# Patient Record
Sex: Female | Born: 1955 | Race: White | Hispanic: No | Marital: Married | Smoking: Never smoker
Health system: Southern US, Community
[De-identification: ages and names within clinical notes are randomized; demographics above are authoritative.]

## PROBLEM LIST (undated history)

## (undated) DIAGNOSIS — R112 Nausea with vomiting, unspecified: Secondary | ICD-10-CM

## (undated) DIAGNOSIS — E039 Hypothyroidism, unspecified: Secondary | ICD-10-CM

## (undated) DIAGNOSIS — M1990 Unspecified osteoarthritis, unspecified site: Secondary | ICD-10-CM

## (undated) DIAGNOSIS — Z9889 Other specified postprocedural states: Secondary | ICD-10-CM

## (undated) DIAGNOSIS — Z85828 Personal history of other malignant neoplasm of skin: Secondary | ICD-10-CM

## (undated) DIAGNOSIS — I10 Essential (primary) hypertension: Secondary | ICD-10-CM

## (undated) DIAGNOSIS — C801 Malignant (primary) neoplasm, unspecified: Secondary | ICD-10-CM

## (undated) DIAGNOSIS — R51 Headache: Secondary | ICD-10-CM

## (undated) DIAGNOSIS — R739 Hyperglycemia, unspecified: Secondary | ICD-10-CM

## (undated) DIAGNOSIS — M75100 Unspecified rotator cuff tear or rupture of unspecified shoulder, not specified as traumatic: Secondary | ICD-10-CM

## (undated) DIAGNOSIS — I8390 Asymptomatic varicose veins of unspecified lower extremity: Secondary | ICD-10-CM

## (undated) DIAGNOSIS — K224 Dyskinesia of esophagus: Secondary | ICD-10-CM

## (undated) HISTORY — PX: REDUCTION MAMMAPLASTY: SUR839

## (undated) HISTORY — DX: Asymptomatic varicose veins of unspecified lower extremity: I83.90

## (undated) HISTORY — PX: BREAST BIOPSY: SHX20

---
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
## 1992-12-29 HISTORY — PX: THYROIDECTOMY, PARTIAL: SHX18

## 1994-12-29 HISTORY — PX: KNEE ARTHROTOMY: SUR107

## 1995-12-30 HISTORY — PX: BUNIONECTOMY: SHX129

## 2000-06-01 ENCOUNTER — Encounter: Payer: Self-pay | Admitting: Obstetrics and Gynecology

## 2000-06-01 ENCOUNTER — Encounter: Admission: RE | Admit: 2000-06-01 | Discharge: 2000-06-01 | Payer: Self-pay | Admitting: Obstetrics and Gynecology

## 2000-07-28 ENCOUNTER — Other Ambulatory Visit: Admission: RE | Admit: 2000-07-28 | Discharge: 2000-07-28 | Payer: Self-pay | Admitting: Obstetrics and Gynecology

## 2001-06-09 ENCOUNTER — Encounter: Payer: Self-pay | Admitting: Obstetrics and Gynecology

## 2001-06-09 ENCOUNTER — Encounter: Admission: RE | Admit: 2001-06-09 | Discharge: 2001-06-09 | Payer: Self-pay | Admitting: Obstetrics and Gynecology

## 2001-06-10 ENCOUNTER — Encounter: Payer: Self-pay | Admitting: Obstetrics and Gynecology

## 2001-06-10 ENCOUNTER — Encounter: Admission: RE | Admit: 2001-06-10 | Discharge: 2001-06-10 | Payer: Self-pay | Admitting: Obstetrics and Gynecology

## 2002-07-05 ENCOUNTER — Encounter: Admission: RE | Admit: 2002-07-05 | Discharge: 2002-07-05 | Payer: Self-pay | Admitting: Obstetrics and Gynecology

## 2002-07-05 ENCOUNTER — Encounter: Payer: Self-pay | Admitting: Obstetrics and Gynecology

## 2002-12-29 HISTORY — PX: ABDOMINAL HYSTERECTOMY: SHX81

## 2002-12-29 HISTORY — PX: CHOLECYSTECTOMY: SHX55

## 2003-08-14 ENCOUNTER — Encounter: Admission: RE | Admit: 2003-08-14 | Discharge: 2003-08-14 | Payer: Self-pay | Admitting: Obstetrics and Gynecology

## 2003-08-14 ENCOUNTER — Encounter: Payer: Self-pay | Admitting: Obstetrics and Gynecology

## 2004-08-14 ENCOUNTER — Encounter: Admission: RE | Admit: 2004-08-14 | Discharge: 2004-08-14 | Payer: Self-pay | Admitting: Internal Medicine

## 2005-02-11 ENCOUNTER — Inpatient Hospital Stay (HOSPITAL_COMMUNITY): Admission: RE | Admit: 2005-02-11 | Discharge: 2005-02-13 | Payer: Self-pay | Admitting: Obstetrics and Gynecology

## 2005-02-11 ENCOUNTER — Encounter (INDEPENDENT_AMBULATORY_CARE_PROVIDER_SITE_OTHER): Payer: Self-pay | Admitting: *Deleted

## 2005-11-05 ENCOUNTER — Encounter: Admission: RE | Admit: 2005-11-05 | Discharge: 2005-11-05 | Payer: Self-pay | Admitting: Obstetrics and Gynecology

## 2006-12-08 ENCOUNTER — Encounter: Admission: RE | Admit: 2006-12-08 | Discharge: 2006-12-08 | Payer: Self-pay | Admitting: Obstetrics and Gynecology

## 2007-12-10 ENCOUNTER — Encounter: Admission: RE | Admit: 2007-12-10 | Discharge: 2007-12-10 | Payer: Self-pay | Admitting: Obstetrics and Gynecology

## 2008-03-15 ENCOUNTER — Ambulatory Visit (HOSPITAL_COMMUNITY): Admission: RE | Admit: 2008-03-15 | Discharge: 2008-03-15 | Payer: Self-pay | Admitting: Obstetrics and Gynecology

## 2008-12-12 ENCOUNTER — Encounter: Admission: RE | Admit: 2008-12-12 | Discharge: 2008-12-12 | Payer: Self-pay | Admitting: Obstetrics and Gynecology

## 2008-12-15 ENCOUNTER — Encounter: Admission: RE | Admit: 2008-12-15 | Discharge: 2008-12-15 | Payer: Self-pay | Admitting: Obstetrics and Gynecology

## 2009-01-03 ENCOUNTER — Ambulatory Visit (HOSPITAL_COMMUNITY): Admission: RE | Admit: 2009-01-03 | Discharge: 2009-01-03 | Payer: Self-pay | Admitting: Obstetrics and Gynecology

## 2010-01-15 ENCOUNTER — Encounter: Admission: RE | Admit: 2010-01-15 | Discharge: 2010-01-15 | Payer: Self-pay | Admitting: Obstetrics and Gynecology

## 2011-01-16 ENCOUNTER — Encounter
Admission: RE | Admit: 2011-01-16 | Discharge: 2011-01-16 | Payer: Self-pay | Source: Home / Self Care | Attending: Obstetrics and Gynecology | Admitting: Obstetrics and Gynecology

## 2011-05-16 NOTE — Op Note (Signed)
NAMEMARYBEL, Ruth Long                ACCOUNT NO.:  [id number]   MEDICAL RECORD NO.:  [id number]          PATIENT TYPE:  INP   LOCATION:  X003                         FACILITY:  [facility]   PHYSICIAN:  Malachi Pro. Ambrose Mantle, M.D. DATE OF BIRTH:  March 17, 1956   DATE OF PROCEDURE:  02/11/2005  DATE OF DISCHARGE:                                 OPERATIVE REPORT   PREOPERATIVE DIAGNOSES:  1.  Dyspareunia.  2.  Fibroid.  3.  Right ovarian cyst with a septation on ultrasound.   POSTOPERATIVE DIAGNOSES:  1.  Dyspareunia.  2.  Fibroid.  3.  Right ovarian cyst with a septation on ultrasound.  4.  Hematuria of undetermined origin.   OPERATIONS:  1.  Abdominal hysterectomy.  2.  Bilateral salpingo-oophorectomy.  3.  Cystoscopy.   SURGEONS:  Dr. Ambrose Mantle performed the TAH and BSO with Dr. Jackelyn Knife as  assistant.  Dr. Jackelyn Knife performed the cystoscopy.   ANESTHESIA:  General.   DESCRIPTION OF PROCEDURE:  The patient was brought to the operating room and  given a general anesthesia and intubated.  She was placed on frog leg  position.  Because of her allergy to iodine, we elected not to use Betadine  and used a non-iodine prep even though it is not certain that she is  sensitive to Betadine.  The vulva, vagina, perineum and urethra were  prepped.  The bladder was cauterized with a Foley and hooked to straight  drain.  Exam revealed the uterus to be upper limit of normal size.  The left  adnexa was clear.  The right adnexa contained a 4 cm cyst on the ovary.  The  patient was placed supine.  The abdomen was draped as a sterile field and a  transverse incision was made about two fingerbreadths above her C-section  incision and carried in layers through the skin, subcutaneous tissue and  fascia.  The fascia was then separated from the rectus muscle superiorly and  inferiorly.  The rectus muscle was split in the midline.  The peritoneum was  opened vertically.  Exploration of the upper abdomen revealed  the liver to  be smooth.  Both kidneys felt normal.  No upper abdominal abnormalities were  noted.  Pelvic washings were obtained and preserved.  Exploration of the  pelvis revealed the uterus to have a 3 cm fibroid on the posterior fundus.  Both tubes showed evidence of previous tubal cauterization.  The left ovary  was normal.  The right ovary was about a 4 cm structure that had no  excrescences and appeared to be a clear walled cyst.  Both cul-de-sacs were  free of disease.  Packs and retractors were used for exposure.  The uterus  was elevated into the operative field and both round ligaments were divided  with the Bovie and a bladder flap was developed.  The infundibular pelvic  ligaments were isolated and cut between clamps and doubly suture ligated.  Uterine vessels were skeletonized, clamped, cut and suture ligated.  Parametrial and paracervical tissues were clamped, cut and suture ligated.  The uterosacral ligaments were clamped, cut,  suture ligated and held.  The  the vaginal angles were entered and the uterus was removed by transecting  the upper vagina.  I had sent the right tube and ovary for frozen section.  Dr. Quentin Ore reported that it was a benign serous cyst.  The central portion  of the vagina was closed with interrupted figure-of-eight sutures of 0  Vicryl.  About this time, the anesthetist reported that there was a little  bit of hematuria in the tubing, so I inflated the bladder with several  hundred cubic centimeters of methylene blue stain fluid.  There was no  leakage.  I carefully palpated all sutures to make sure there was no  evidence that any of them were close to the bladder.  I then drained the  bladder and had the nurse anesthetist inject indigo carmine intravenously.  A diligent search was made for hemostasis and I waited 15 minutes after the  indigo carmine was injected to try to be certain that the kidneys had  processed the indigo carmine.  There was no  leakage seen.  Hemostasis was  complete.  Packs and retractors were removed.  The abdominal wall was closed  with a running suture of 0 Vicryl on the peritoneum, interrupted 0 Vicryl on  the rectus muscle, two running sutures of 0 Vicryl on the fascia, a running  3-0 Vicryl on the subcutaneous tissue and staples on the skin.  After the  incision was cleaned and dressed, the patient was placed in frog leg  position and Dr. Jackelyn Knife used a cystoscope to enter the bladder and saw  both ureteral orifices.  We could easily see the jets of urine coming out of  both ureteral orifices.  So the etiology of the hematuria remains uncertain.  It must have came from either pulling on the catheter against the bladder  neck or possibly putting some pressure on the Foley bulb as I was pushing  the bladder inferiorly.  At any rate, there was no evidence of ureteral or  bladder injury and the patient is returning to recovery in satisfactory  condition.      TFH/MEDQ  D:  02/11/2005  T:  02/11/2005  Job:  [id number]

## 2011-05-16 NOTE — Op Note (Signed)
NAMEWYNONA, Ruth Long                ACCOUNT NO.:  [id number]   MEDICAL RECORD NO.:  [id number]          PATIENT TYPE:  INP   LOCATION:  0440                         FACILITY:  [facility]   PHYSICIAN:  Zenaida Niece, M.D.DATE OF BIRTH:  05/15/1956   DATE OF PROCEDURE:  02/13/2005  DATE OF DISCHARGE:                                 OPERATIVE REPORT   PREOPERATIVE DIAGNOSIS:  Intraoperative hematuria.   POSTOPERATIVE DIAGNOSIS:  Intraoperative hematuria.   PROCEDURE:  Cystoscopy.   PROCEDURE IN DETAIL:  Dr. Ambrose Mantle has previously dictated the operative note  for his hysterectomy. During the hysterectomy, it was noted that there were  some hematuria. Once Dr. Ambrose Mantle was done with the hysterectomy portion and  the skin was closed, the patient was placed in the frog-leg position, and  her Foley catheter was removed. Her previous prep appeared to be adequate. A  70-degree cystoscope was inserted through the urethra into the bladder. The  patient had previously been given indigo carmine IV, and we waited 15  minutes to make sure there was no intra-abdominal spill from the ureters.  The bladder was filled via the cystoscope, and the bladder was inspected and  found to be normal without sutures in the bladder. Both ureteral orifices  were easily identified, and strong jets of blue urine were seen to come from  each ureteral orifice confirming ureteral patency. No explanation for the  hematuria was identified. The cystoscope was removed, and a Foley catheter  was placed. The patient was then awakened and taken to the recovery in  stable condition.      TDM/MEDQ  D:  02/13/2005  T:  02/13/2005  Job:  [id number]

## 2011-05-16 NOTE — H&P (Signed)
NAMESHARNISE, BLOUGH                ACCOUNT NO.:  [id number]   MEDICAL RECORD NO.:  [id number]          PATIENT TYPE:  INP   LOCATION:  NA                           FACILITY:  [facility]   PHYSICIAN:  Malachi Pro. Ambrose Mantle, M.D. DATE OF BIRTH:  24-Sep-1956   DATE OF ADMISSION:  02/11/2005  DATE OF DISCHARGE:                                HISTORY & PHYSICAL   PRESENT ILLNESS:  This is a 55 year old white female, para 1-0-0-1, who is  admitted to the hospital for abdominal hysterectomy, bilateral salpingo-  oophorectomy, because of pelvic pain, dyspareunia, and an adnexal cyst on  the right.  This patient's last menstrual period was in 2000.  Recently, she  began having lower abdominal pain, and underwent an ultrasound that showed a  fibroid and an adnexal cyst on the right that had a septation.  There was no  free fluid present.  The patient states that intercourse is out of the  question.  She has significant pain when the penis hits something inside.  She is admitted now for abdominal hysterectomy and bilateral salpingo-  oophorectomy.   PAST MEDICAL HISTORY:   ALLERGIES:  1.  Allergy to PENICILLIN.  2.  She states she is allergic to IODINE.   MEDICATIONS:  1.  Diuretic.  2.  Synthroid.   OPERATIONS:  1.  Tonsillectomy.  2.  Cholecystectomy.  3.  Cesarean section.  4.  Tubal ligation.  5.  Breast reduction.  6.  Bunions on her feet.  7.  Knee surgery.  8.  Appendectomy.  9.  Thyroidectomy.   ILLNESSES:  She did have a thyroid abnormality.  She has no known heart  problems.   SOCIAL HISTORY:  Does not drink or smoke.   REVIEW OF SYSTEMS:  Negative, except for some headaches, and as in the  present illness.   FAMILY HISTORY:  Mother died at 75 of cancer.  Father died at 34 of cancer.  No sisters.  One brother with heart disease and diabetes.   PHYSICAL EXAMINATION:  GENERAL:  Well-developed, attractive white female, in  no distress.  VITAL SIGNS:  Weight 141 pounds.  Blood  pressure 130/100, pulse 80.  HEENT:  No cranial abnormalities.  Extraocular movements are intact.  Nose  and pharynx clear.  NECK:  Supple without thyromegaly.  HEART:  Normal size and sounds.  No murmurs.  LUNGS:  Clear to auscultation.  BREASTS:  Soft without masses.  Reduction mammoplasty scar is present.  ABDOMEN:  Soft and nontender.  No masses are palpable.  Liver, spleen, and  kidneys are not felt.  PELVIC:  Vulva and vagina are clean.  BUS negative.  Cervix is clean.  Pap  smear on August 14, 2004 was negative for intraepithelial lesion and  malignancy.  The uterus is anterior, hard to feel.  By ultrasound, it showed  a fibroid, 3.3 x 2.9 cm.  There is a palpable right adnexal mass, 4 x 4 cm,  that by ultrasound showed a cyst with a thin septation.  RECTAL:  Negative.   LABORATORY DATA:  CA125 was 13.8.  ADMITTING IMPRESSION:  1.  Pelvic pain.  2.  Dyspareunia.  3.  Right adnexal mass.  4.  Leiomyomata.   PLAN:  The patient is admitted for abdominal hysterectomy, bilateral  salpingo-oophorectomy.  She has been informed that the surgery will not  necessarily eliminate her pain, but will give a diagnosis.  The best way to  approach her surgery, whether a transverse or midline incision, is not  clear.  My thought is that the likelihood of malignancy is quite low, and  quite likely I will make a transverse incision.  She has been informed of  the risks of surgery including, but not limited to, heart attack, stroke,  pulmonary embolus, wound disruption, hemorrhage with need for reoperation  and/or transfusion, fistula formation, nerve injury, and intestinal  obstruction.  She also has been informed that surgery may have an  unpredictable impact on her sex drive.  She understands and agrees to  proceed.      TFH/MEDQ  D:  02/10/2005  T:  02/10/2005  Job:  [id number]

## 2011-05-16 NOTE — Discharge Summary (Signed)
[name], SPAID                ACCOUNT NO.:  [id number]   MEDICAL RECORD NO.:  [id number]          PATIENT TYPE:  INP   LOCATION:  0440                         [facility]ACILITY:  [facility]   PHYSICIAN:  Malachi Pro. Ambrose Mantle, M.D. DATE OF BIRTH:  07-Sep-1956   DATE OF ADMISSION:  02/11/2005  DATE OF DISCHARGE:  02/13/2005                                 DISCHARGE SUMMARY   HISTORY OF PRESENT ILLNESS:  This is a 55 year old white female admitted  because of right pelvic mass, fibroid, and dyspareunia.   HOSPITAL COURSE:  The patient underwent abdominal hysterectomy, bilateral  salpingo-oophorectomy, and cystoscopy.  Dr. Ambrose Mantle did the abdominal  hysterectomy and BSO and the patient was noted to have a slight amount of  hematuria so Dr. Jackelyn Knife did a cystoscopy to rule out any intravesical  lesions.  Postoperatively, the patient did well.  She voided well without  difficulty, passed flatus, tolerated a liquid diet, and on the second  postoperative day, her incision seemed to be healing well without difficulty  and she was discharged.   LABORATORY DATA:  Initial hemoglobin 14.1, hematocrit 40.9, white count  8100, platelet count 294,000, normal differential.  Comprehensive metabolic  profile was normal.  Urinalysis was negative.  Chest x-ray showed no active  disease.  EKG showed borderline EKG with incomplete right bundle branch  block.  Pathology report showed right ovary and fallopian tube with serocyst  adenofibroma 5 cm, benign right fallopian tube.  Uterine cervix, left ovary,  and fallopian tube:  Cervix with chronic cervicitis and squamous metaplasia.  No intra-epithelial lesion.  Atrophic endometrium.  No hyperplasia or  malignancy.  Myometrium with adenomyosis and multiple leiomyomas.  Left  ovary with serocyst adenofibroma 1 cm, benign left fallopian tube.   FINAL DIAGNOSES:  1.  Dyspareunia.  2.  Leiomyomata uteri.  3.  Adenomyosis.  4.  Bilateral cyst adenofibromas of the  ovaries.   FINAL CONDITION:  Improved.   INSTRUCTIONS:  1.  Regular diet.  2.  No vaginal entrance.  3.  No heavy lifting or strenuous activity.  4.  Call with any temperature elevation greater than 100.4 degrees.  5.  Call with any problems.   OPERATION:  1.  Abdominal hysterectomy.  2.  Bilateral salpingo-oophorectomy.  3.  Cystoscopy.   MEDICATIONS:  1.  Mepergan Fortis 24 tablets 1 every 4-6 hours as needed for pain.  2.  If the patient's nausea is not relieved by the Phenergan, discontinue      the Phenergan and use Zofran 4 mg by mouth every 4-6 hours as needed.   FOLLOW UP:  The patient is to return in 4 days to have her staples removed.      TFH/MEDQ  D:  02/13/2005  T:  02/13/2005  Job:  [id number]

## 2011-12-19 ENCOUNTER — Other Ambulatory Visit: Payer: Self-pay | Admitting: Obstetrics and Gynecology

## 2011-12-19 DIAGNOSIS — Z1231 Encounter for screening mammogram for malignant neoplasm of breast: Secondary | ICD-10-CM

## 2012-01-19 ENCOUNTER — Ambulatory Visit: Payer: Self-pay

## 2012-01-20 ENCOUNTER — Ambulatory Visit
Admission: RE | Admit: 2012-01-20 | Discharge: 2012-01-20 | Disposition: A | Discharge status: Home / Self Care | Payer: BC Managed Care – PPO | Source: Ambulatory Visit | Attending: Obstetrics and Gynecology | Admitting: Obstetrics and Gynecology

## 2012-01-20 DIAGNOSIS — Z1231 Encounter for screening mammogram for malignant neoplasm of breast: Secondary | ICD-10-CM

## 2012-01-23 ENCOUNTER — Other Ambulatory Visit: Payer: Self-pay | Admitting: Obstetrics and Gynecology

## 2012-01-23 DIAGNOSIS — R928 Other abnormal and inconclusive findings on diagnostic imaging of breast: Secondary | ICD-10-CM

## 2012-01-29 ENCOUNTER — Ambulatory Visit
Admission: RE | Admit: 2012-01-29 | Discharge: 2012-01-29 | Disposition: A | Discharge status: Home / Self Care | Payer: BC Managed Care – PPO | Source: Ambulatory Visit | Attending: Obstetrics and Gynecology | Admitting: Obstetrics and Gynecology

## 2012-01-29 DIAGNOSIS — R928 Other abnormal and inconclusive findings on diagnostic imaging of breast: Secondary | ICD-10-CM

## 2013-01-04 ENCOUNTER — Other Ambulatory Visit: Payer: Self-pay | Admitting: Obstetrics and Gynecology

## 2013-01-04 DIAGNOSIS — Z9889 Other specified postprocedural states: Secondary | ICD-10-CM

## 2013-01-04 DIAGNOSIS — Z1231 Encounter for screening mammogram for malignant neoplasm of breast: Secondary | ICD-10-CM

## 2013-01-31 ENCOUNTER — Ambulatory Visit
Admission: RE | Admit: 2013-01-31 | Discharge: 2013-01-31 | Disposition: A | Discharge status: Home / Self Care | Payer: BC Managed Care – PPO | Source: Ambulatory Visit | Attending: Obstetrics and Gynecology | Admitting: Obstetrics and Gynecology

## 2013-01-31 DIAGNOSIS — Z1231 Encounter for screening mammogram for malignant neoplasm of breast: Secondary | ICD-10-CM

## 2013-01-31 DIAGNOSIS — Z9889 Other specified postprocedural states: Secondary | ICD-10-CM

## 2014-01-23 ENCOUNTER — Other Ambulatory Visit: Payer: Self-pay

## 2014-01-23 DIAGNOSIS — Z1231 Encounter for screening mammogram for malignant neoplasm of breast: Secondary | ICD-10-CM

## 2014-01-23 DIAGNOSIS — Z9889 Other specified postprocedural states: Secondary | ICD-10-CM

## 2014-02-07 ENCOUNTER — Ambulatory Visit
Admission: RE | Admit: 2014-02-07 | Discharge: 2014-02-07 | Disposition: A | Discharge status: Home / Self Care | Payer: BC Managed Care – PPO | Source: Ambulatory Visit

## 2014-02-07 DIAGNOSIS — Z1231 Encounter for screening mammogram for malignant neoplasm of breast: Secondary | ICD-10-CM

## 2014-02-07 DIAGNOSIS — Z9889 Other specified postprocedural states: Secondary | ICD-10-CM

## 2014-05-31 ENCOUNTER — Other Ambulatory Visit: Payer: Self-pay | Admitting: *Deleted

## 2014-05-31 DIAGNOSIS — I83893 Varicose veins of bilateral lower extremities with other complications: Secondary | ICD-10-CM

## 2014-07-03 ENCOUNTER — Other Ambulatory Visit: Payer: Self-pay | Admitting: Orthopedic Surgery

## 2014-07-03 NOTE — H&P (Signed)
Ruth Long is an 58 y.o. female.   Chief Complaint: R shoulder pain HPI: The patient is a 58 year old female. They are right handed and present today reporting pain at the right shoulder that began 2 year(s) ago. The patient reports that the shoulder symptoms began following a specific injury. The injury resulted from [facility] a pop in shoulder while mowing the lawn that occurred at home 2 years. The patient reports symptoms which include shoulder pain, catching, decreased range of motion and night pain. There is no radiation of symptoms. The patient reports that these symptoms are present frequently (when she moves it). The patient describes these symptoms as moderate in severity (Severe when she lifts it) and worsening. Symptoms are exacerbated by motion at the shoulder, elevation of the shoulder and lifting. Associated symptoms include numbness in the arm (tingling). The patient was previously evaluated by a primary physician (Dr. Virl Cagey in [place]). Note for "Shoulder Complaints": Has pain medications that help some, but wants to stay off of those  No past medical history on file.  No past surgical history on file.  No family history on file. Social History:  has no tobacco, alcohol, and drug history on file.  Allergies: Allergies not on file   (Not in a hospital admission)  No results found for this or any previous visit (from the past 48 hour(s)). No results found.  Review of Systems  Constitutional: Negative.   HENT: Negative.   Eyes: Negative.   Respiratory: Negative.   Cardiovascular: Negative.   Gastrointestinal: Negative.   Genitourinary: Negative.   Musculoskeletal: Positive for joint pain.  Skin: Negative.   Neurological: Negative.   Psychiatric/Behavioral: Negative.     There were no vitals taken for this visit. Physical Exam  Constitutional: She is oriented to person, place, and time. She appears well-developed and well-nourished.  HENT:  Head: Normocephalic  and atraumatic.  Eyes: Conjunctivae and EOM are normal. Pupils are equal, round, and reactive to light.  Neck: Normal range of motion. Neck supple.  Cardiovascular: Normal rate and regular rhythm.   Respiratory: Effort normal and breath sounds normal.  GI: Soft. Bowel sounds are normal.  Musculoskeletal:  On exam positive impingement sign. Decreased internal rotation in abduction, weakness in external rotation. Tender in the anterior subacromial region. Negative crossover. Neurovascularly intact.    Inspection of the cervical spine reveals a normal lordosis without evidence of paraspinous spasms or soft tissue swelling. Nontender to palpation. Full flexion, full extension, full left and right lateral rotation. Extension combined with lateral flexion does not reproduce pain. Negative Tinel's at median and ulnar nerves at the elbow. Negative carpal compression test at the wrists. Motor of the upper extremities is 5/5 including biceps, triceps, brachioradialis,wrist flexion, wrist extension, finger flexion, finger extension. Reflexes are normoreflexic. Sensory exam is intact to light touch. There is no Hoffmann sign. Nontender over the thoracic spine.  Negative lift off. Negative Speeds test.  Neurological: She is alert and oriented to person, place, and time. She has normal reflexes.  Skin: Skin is warm and dry.  Psychiatric: She has a normal mood and affect.    Three view radiographs of the shoulder demonstrate type II acromion. Minimal AC arthrosis.  MRI arthrogram with full thickness retracted RCT  Assessment/Plan R shoulder RCT  Pt with pain for 2+ years R shoulder refractory to conservative tx with RCT on MR arthrogram. Dr. Tonita Cong discussed options with the patient, given duration of time, failed conservative tx and retracted tear, recommend  proceeding with R shoulder mini-open RCR/SAD. We discussed surgery itself as well as risks, complications and alternatives including but not  limited to DVT, PE, infx, bleeding, failure of procedure, need for secondary procedure, anesthesia risk, even death. Discussed post-op protocols, sling, PT. She desires to proceed. Follow up 10-14 days post-op for suture removal.  Plan R shoulder mini-open SAD/RCR  BISSELL, JACLYN M. PA-C for Dr. Tonita Cong 07/03/2014, 8:15 PM

## 2014-07-06 ENCOUNTER — Encounter (HOSPITAL_COMMUNITY): Payer: Self-pay | Admitting: Pharmacy Technician

## 2014-07-10 ENCOUNTER — Other Ambulatory Visit (HOSPITAL_COMMUNITY): Payer: Self-pay | Admitting: *Deleted

## 2014-07-10 NOTE — Patient Instructions (Addendum)
Ruth Long  07/10/2014                           YOUR PROCEDURE IS SCHEDULED ON: 07/13/14 at 7:30 am               [place]  SIGNS TO SHORT STAY CENTER                 ARRIVE AT SHORT STAY AT:  5:30 AM               CALL THIS NUMBER IF ANY PROBLEMS THE DAY OF SURGERY :               [phone]                                REMEMBER:   Do not eat food or drink liquids AFTER MIDNIGHT                  Take these medicines the morning of surgery with               A SIPS OF WATER :   PRILOSEC / FENFIBRATE      Do not wear jewelry, make-up   Do not wear lotions, powders, or perfumes.   Do not shave legs or underarms 12 hrs. before surgery (men may shave face)  Do not bring valuables to the hospital.  Contacts, dentures or bridgework may not be worn into surgery.  Leave suitcase in the car. After surgery it may be brought to your room.  For patients admitted to the hospital more than one night, checkout time is            11:00 AM                                                       The day of discharge.   Patients discharged the day of surgery will not be allowed to drive home.            If going home same day of surgery, must have someone stay with you              FIRST 24 hrs at home and arrange for some one to drive you              home from hospital.   ________________________________________________________________________                                                                        Early  Before surgery, you can play an important role.  Because skin is not sterile, your skin needs to be as free of germs as possible.  You can reduce the number of  germs on your skin by washing with CHG (chlorahexidine gluconate) soap before surgery.  CHG is an antiseptic cleaner which kills germs and bonds with the skin to continue killing germs even after  washing. Please DO NOT use if you have an allergy to CHG or antibacterial soaps.  If your skin becomes reddened/irritated stop using the CHG and inform your nurse when you arrive at Short Stay. Do not shave (including legs and underarms) for at least 48 hours prior to the first CHG shower.  You may shave your face. Please follow these instructions carefully:   1.  Shower with CHG Soap the night before surgery and the  morning of Surgery.   2.  If you choose to wash your hair, wash your hair first as usual with your  normal  Shampoo.   3.  After you shampoo, rinse your hair and body thoroughly to remove the  shampoo.                                         4.  Use CHG as you would any other liquid soap.  You can apply chg directly  to the skin and wash . Gently wash with scrungie or clean wascloth    5.  Apply the CHG Soap to your body ONLY FROM THE NECK DOWN.   Do not use on open                           Wound or open sores. Avoid contact with eyes, ears mouth and genitals (private parts).                        Genitals (private parts) with your normal soap.              6.  Wash thoroughly, paying special attention to the area where your surgery  will be performed.   7.  Thoroughly rinse your body with warm water from the neck down.   8.  DO NOT shower/wash with your normal soap after using and rinsing off  the CHG Soap .                9.  Pat yourself dry with a clean towel.             10.  Wear clean pajamas.             11.  Place clean sheets on your bed the night of your first shower and do not  sleep with pets.  Day of Surgery : Do not apply any lotions/deodorants the morning of surgery.  Please wear clean clothes to the hospital/surgery center.  FAILURE TO FOLLOW THESE INSTRUCTIONS MAY RESULT IN THE CANCELLATION OF YOUR SURGERY    PATIENT  SIGNATURE_________________________________  ______________________________________________________________________     Ruth Long  An incentive spirometer is a tool that can help keep your lungs clear and active. This tool measures how well you are filling your lungs with each breath. Taking long deep breaths may help reverse or decrease the chance of developing breathing (pulmonary) problems (especially infection) following:  A long period of time when you are unable to move or be active. BEFORE THE PROCEDURE   If the spirometer includes an indicator to show your best effort, your nurse or respiratory therapist will set it  to a desired goal.  If possible, sit up straight or lean slightly forward. Try not to slouch.  Hold the incentive spirometer in an upright position. INSTRUCTIONS FOR USE  1. Sit on the edge of your bed if possible, or sit up as far as you can in bed or on a chair. 2. Hold the incentive spirometer in an upright position. 3. Breathe out normally. 4. Place the mouthpiece in your mouth and seal your lips tightly around it. 5. Breathe in slowly and as deeply as possible, raising the piston or the ball toward the top of the column. 6. Hold your breath for 3-5 seconds or for as long as possible. Allow the piston or ball to fall to the bottom of the column. 7. Remove the mouthpiece from your mouth and breathe out normally. 8. Rest for a few seconds and repeat Steps 1 through 7 at least 10 times every 1-2 hours when you are awake. Take your time and take a few normal breaths between deep breaths. 9. The spirometer may include an indicator to show your best effort. Use the indicator as a goal to work toward during each repetition. 10. After each set of 10 deep breaths, practice coughing to be sure your lungs are clear. If you have an incision (the cut made at the time of surgery), support your incision when coughing by placing a pillow or rolled up towels firmly  against it. Once you are able to get out of bed, walk around indoors and cough well. You may stop using the incentive spirometer when instructed by your caregiver.  RISKS AND COMPLICATIONS  Take your time so you do not get dizzy or light-headed.  If you are in pain, you may need to take or ask for pain medication before doing incentive spirometry. It is harder to take a deep breath if you are having pain. AFTER USE  Rest and breathe slowly and easily.  It can be helpful to keep track of a log of your progress. Your caregiver can provide you with a simple table to help with this. If you are using the spirometer at home, follow these instructions: [place] IF:   You are having difficultly using the spirometer.  You have trouble using the spirometer as often as instructed.  Your pain medication is not giving enough relief while using the spirometer.  You develop fever of 100.5 F (38.1 C) or higher. SEEK IMMEDIATE MEDICAL CARE IF:   You cough up bloody sputum that had not been present before.  You develop fever of 102 F (38.9 C) or greater.  You develop worsening pain at or near the incision site. MAKE SURE YOU:   Understand these instructions.  Will watch your condition.  Will get help right away if you are not doing well or get worse. Document Released: 04/27/2007 Document Revised: 03/08/2012 Document Reviewed: 06/28/2007 [facility] Patient Information 2014 [place], [place].   ________________________________________________________________________

## 2014-07-11 ENCOUNTER — Encounter (HOSPITAL_COMMUNITY): Payer: Self-pay

## 2014-07-11 ENCOUNTER — Encounter (HOSPITAL_COMMUNITY)
Admission: RE | Admit: 2014-07-11 | Discharge: 2014-07-11 | Disposition: A | Discharge status: Home / Self Care | Payer: BC Managed Care – PPO | Source: Ambulatory Visit | Attending: Specialist | Admitting: Specialist

## 2014-07-11 ENCOUNTER — Other Ambulatory Visit: Payer: Self-pay

## 2014-07-11 ENCOUNTER — Ambulatory Visit (HOSPITAL_COMMUNITY)
Admission: RE | Admit: 2014-07-11 | Discharge: 2014-07-11 | Disposition: A | Discharge status: Home / Self Care | Payer: BC Managed Care – PPO | Source: Ambulatory Visit | Attending: Anesthesiology | Admitting: Anesthesiology

## 2014-07-11 DIAGNOSIS — I10 Essential (primary) hypertension: Secondary | ICD-10-CM | POA: Insufficient documentation

## 2014-07-11 DIAGNOSIS — Z01812 Encounter for preprocedural laboratory examination: Secondary | ICD-10-CM | POA: Insufficient documentation

## 2014-07-11 DIAGNOSIS — Z01810 Encounter for preprocedural cardiovascular examination: Secondary | ICD-10-CM | POA: Insufficient documentation

## 2014-07-11 DIAGNOSIS — Z9089 Acquired absence of other organs: Secondary | ICD-10-CM | POA: Insufficient documentation

## 2014-07-11 DIAGNOSIS — Z01818 Encounter for other preprocedural examination: Secondary | ICD-10-CM | POA: Insufficient documentation

## 2014-07-11 HISTORY — DX: Essential (primary) hypertension: I10

## 2014-07-11 HISTORY — DX: Unspecified rotator cuff tear or rupture of unspecified shoulder, not specified as traumatic: M75.100

## 2014-07-11 HISTORY — DX: Other specified postprocedural states: Z98.890

## 2014-07-11 HISTORY — DX: Headache: R51

## 2014-07-11 HISTORY — DX: Dyskinesia of esophagus: K22.4

## 2014-07-11 HISTORY — DX: Nausea with vomiting, unspecified: R11.2

## 2014-07-11 HISTORY — DX: Personal history of other malignant neoplasm of skin: Z85.828

## 2014-07-11 HISTORY — DX: Hyperglycemia, unspecified: R73.9

## 2014-07-11 HISTORY — DX: Malignant (primary) neoplasm, unspecified: C80.1

## 2014-07-11 LAB — CBC
HCT: 38.6 % (ref 36.0–46.0)
Hemoglobin: 12.6 g/dL (ref 12.0–15.0)
MCH: 27.5 pg (ref 26.0–34.0)
MCHC: 32.6 g/dL (ref 30.0–36.0)
MCV: 84.3 fL (ref 78.0–100.0)
PLATELETS: 302 10*3/uL (ref 150–400)
RBC: 4.58 MIL/uL (ref 3.87–5.11)
RDW: 13.5 % (ref 11.5–15.5)
WBC: 6 10*3/uL (ref 4.0–10.5)

## 2014-07-11 LAB — BASIC METABOLIC PANEL
Anion gap: 14 (ref 5–15)
BUN: 19 mg/dL (ref 6–23)
CO2: 30 mEq/L (ref 19–32)
Calcium: 10.6 mg/dL — ABNORMAL HIGH (ref 8.4–10.5)
Chloride: 99 mEq/L (ref 96–112)
Creatinine, Ser: 0.9 mg/dL (ref 0.50–1.10)
GFR, EST AFRICAN AMERICAN: 80 mL/min — AB (ref >90)
GFR, EST NON AFRICAN AMERICAN: 69 mL/min — AB (ref >90)
Glucose, Bld: 96 mg/dL (ref 70–99)
Potassium: 3.3 mEq/L — ABNORMAL LOW (ref 3.7–5.3)
SODIUM: 143 meq/L (ref 137–147)

## 2014-07-11 NOTE — Progress Notes (Signed)
Abnormal BMET routed to Dr. Tonita Cong

## 2014-07-12 NOTE — Anesthesia Preprocedure Evaluation (Addendum)
Anesthesia Evaluation  Patient identified by MRN, date of birth, ID band Patient awake    Reviewed: Allergy & Precautions, H&P , NPO status , Patient's Chart, lab work & pertinent test results  History of Anesthesia Complications (+) PONV  Airway Mallampati: II TM Distance: >3 FB Neck ROM: full    Dental no notable dental hx. (+) Teeth Intact, Dental Advisory Given   Pulmonary neg pulmonary ROS,  breath sounds clear to auscultation  Pulmonary exam normal       Cardiovascular Exercise Tolerance: Good hypertension, Pt. on medications negative cardio ROS  Rhythm:regular Rate:Normal     Neuro/Psych negative neurological ROS  negative psych ROS   GI/Hepatic negative GI ROS, Neg liver ROS, GERD-  Medicated and Controlled,  Endo/Other  negative endocrine ROS  Renal/GU negative Renal ROS  negative genitourinary   Musculoskeletal   Abdominal   Peds  Hematology negative hematology ROS (+)   Anesthesia Other Findings Calcium 10.6. Questionable allergy to lidocaine  Reproductive/Obstetrics negative OB ROS                        Anesthesia Physical Anesthesia Plan  ASA: II  Anesthesia Plan: General   Post-op Pain Management:    Induction: Intravenous  Airway Management Planned: Oral ETT  Additional Equipment:   Intra-op Plan:   Post-operative Plan: Extubation in OR  Informed Consent: I have reviewed the patients History and Physical, chart, labs and discussed the procedure including the risks, benefits and alternatives for the proposed anesthesia with the patient or authorized representative who has indicated his/her understanding and acceptance.   Dental Advisory Given  Plan Discussed with: CRNA and Surgeon  Anesthesia Plan Comments:         Anesthesia Quick Evaluation

## 2014-07-13 ENCOUNTER — Encounter (HOSPITAL_COMMUNITY): Payer: BC Managed Care – PPO | Admitting: Anesthesiology

## 2014-07-13 ENCOUNTER — Encounter (HOSPITAL_COMMUNITY)
Admission: RE | Disposition: A | Discharge status: Home / Self Care | Payer: Self-pay | Source: Ambulatory Visit | Attending: Specialist

## 2014-07-13 ENCOUNTER — Ambulatory Visit (HOSPITAL_COMMUNITY)
Admission: RE | Admit: 2014-07-13 | Discharge: 2014-07-13 | Disposition: A | Discharge status: Home / Self Care | Payer: BC Managed Care – PPO | Source: Ambulatory Visit | Attending: Specialist | Admitting: Specialist

## 2014-07-13 ENCOUNTER — Ambulatory Visit (HOSPITAL_COMMUNITY): Payer: BC Managed Care – PPO | Admitting: Anesthesiology

## 2014-07-13 ENCOUNTER — Encounter (HOSPITAL_COMMUNITY): Payer: Self-pay | Admitting: *Deleted

## 2014-07-13 DIAGNOSIS — M719 Bursopathy, unspecified: Principal | ICD-10-CM | POA: Insufficient documentation

## 2014-07-13 DIAGNOSIS — I10 Essential (primary) hypertension: Secondary | ICD-10-CM | POA: Insufficient documentation

## 2014-07-13 DIAGNOSIS — M67919 Unspecified disorder of synovium and tendon, unspecified shoulder: Secondary | ICD-10-CM | POA: Insufficient documentation

## 2014-07-13 DIAGNOSIS — M75121 Complete rotator cuff tear or rupture of right shoulder, not specified as traumatic: Secondary | ICD-10-CM

## 2014-07-13 DIAGNOSIS — K219 Gastro-esophageal reflux disease without esophagitis: Secondary | ICD-10-CM | POA: Insufficient documentation

## 2014-07-13 HISTORY — PX: SHOULDER OPEN ROTATOR CUFF REPAIR: SHX2407

## 2014-07-13 SURGERY — REPAIR, ROTATOR CUFF, OPEN
Anesthesia: General | Site: Shoulder | Laterality: Right

## 2014-07-13 MED ORDER — LACTATED RINGERS IV SOLN
INTRAVENOUS | Status: DC
  Administered 2014-07-13: 07:00:00 via INTRAVENOUS

## 2014-07-13 MED ORDER — HYDROMORPHONE HCL PF 1 MG/ML IJ SOLN: 0.2500 mg | INTRAMUSCULAR | Status: DC | PRN

## 2014-07-13 MED ORDER — CLINDAMYCIN PHOSPHATE 900 MG/50ML IV SOLN
900.0000 mg | Freq: Once | INTRAVENOUS | Status: AC
  Administered 2014-07-13: 900 mg via INTRAVENOUS

## 2014-07-13 MED ORDER — LACTATED RINGERS IV SOLN: INTRAVENOUS | Status: DC

## 2014-07-13 MED ORDER — SUCCINYLCHOLINE CHLORIDE 20 MG/ML IJ SOLN
INTRAMUSCULAR | Status: DC | PRN
  Administered 2014-07-13: 80 mg via INTRAVENOUS

## 2014-07-13 MED ORDER — SUFENTANIL CITRATE 50 MCG/ML IV SOLN
INTRAVENOUS | Status: DC | PRN
  Administered 2014-07-13: 20 ug via INTRAVENOUS

## 2014-07-13 MED ORDER — OXYCODONE-ACETAMINOPHEN 5-325 MG PO TABS: 1.0000 | ORAL_TABLET | ORAL | Status: AC | PRN

## 2014-07-13 MED ORDER — NEOSTIGMINE METHYLSULFATE 10 MG/10ML IV SOLN
INTRAVENOUS | Status: AC
  Filled 2014-07-13: qty 1

## 2014-07-13 MED ORDER — BUPIVACAINE-EPINEPHRINE (PF) 0.5% -1:200000 IJ SOLN
INTRAMUSCULAR | Status: AC
  Filled 2014-07-13: qty 30

## 2014-07-13 MED ORDER — SCOPOLAMINE 1 MG/3DAYS TD PT72
MEDICATED_PATCH | TRANSDERMAL | Status: DC | PRN
  Administered 2014-07-13: 1 via TRANSDERMAL

## 2014-07-13 MED ORDER — ONDANSETRON HCL 4 MG/2ML IJ SOLN
INTRAMUSCULAR | Status: DC | PRN
Start: 2014-07-13 — End: 2014-07-13
  Administered 2014-07-13: 4 mg via INTRAVENOUS

## 2014-07-13 MED ORDER — PROPOFOL 10 MG/ML IV BOLUS
INTRAVENOUS | Status: DC | PRN
  Administered 2014-07-13: 150 mg via INTRAVENOUS

## 2014-07-13 MED ORDER — CISATRACURIUM BESYLATE 20 MG/10ML IV SOLN
INTRAVENOUS | Status: AC
Start: 2014-07-13 — End: 2014-07-13
  Filled 2014-07-13: qty 10

## 2014-07-13 MED ORDER — PHENYLEPHRINE HCL 10 MG/ML IJ SOLN
10.0000 mg | INTRAMUSCULAR | Status: DC | PRN
  Administered 2014-07-13: 20 ug/min via INTRAVENOUS

## 2014-07-13 MED ORDER — ACETAMINOPHEN 10 MG/ML IV SOLN
1000.0000 mg | Freq: Once | INTRAVENOUS | Status: AC
  Administered 2014-07-13: 1000 mg via INTRAVENOUS
  Filled 2014-07-13: qty 100

## 2014-07-13 MED ORDER — KETOROLAC TROMETHAMINE 30 MG/ML IJ SOLN
30.0000 mg | Freq: Four times a day (QID) | INTRAMUSCULAR | Status: DC
  Administered 2014-07-13: 30 mg via INTRAVENOUS
  Filled 2014-07-13: qty 1

## 2014-07-13 MED ORDER — GLYCOPYRROLATE 0.2 MG/ML IJ SOLN
INTRAMUSCULAR | Status: AC
  Filled 2014-07-13: qty 3

## 2014-07-13 MED ORDER — GLYCOPYRROLATE 0.2 MG/ML IJ SOLN
INTRAMUSCULAR | Status: DC | PRN
  Administered 2014-07-13: .6 mg via INTRAVENOUS

## 2014-07-13 MED ORDER — SODIUM CHLORIDE 0.9 % IJ SOLN
INTRAMUSCULAR | Status: AC
Start: 2014-07-13 — End: 2014-07-13
  Filled 2014-07-13: qty 10

## 2014-07-13 MED ORDER — METHOCARBAMOL 500 MG PO TABS: 500.0000 mg | ORAL_TABLET | Freq: Three times a day (TID) | ORAL | Status: DC

## 2014-07-13 MED ORDER — SCOPOLAMINE 1 MG/3DAYS TD PT72
MEDICATED_PATCH | TRANSDERMAL | Status: AC
  Filled 2014-07-13: qty 1

## 2014-07-13 MED ORDER — SUFENTANIL CITRATE 50 MCG/ML IV SOLN
INTRAVENOUS | Status: AC
  Filled 2014-07-13: qty 1

## 2014-07-13 MED ORDER — KETOROLAC TROMETHAMINE 10 MG PO TABS: 10.0000 mg | ORAL_TABLET | Freq: Four times a day (QID) | ORAL | Status: AC | PRN

## 2014-07-13 MED ORDER — KETAMINE HCL 10 MG/ML IJ SOLN
INTRAMUSCULAR | Status: DC | PRN
Start: 2014-07-13 — End: 2014-07-13
  Administered 2014-07-13: 30 mg via INTRAVENOUS
  Administered 2014-07-13: 20 mg via INTRAVENOUS

## 2014-07-13 MED ORDER — MIDAZOLAM HCL 2 MG/2ML IJ SOLN
INTRAMUSCULAR | Status: AC
  Filled 2014-07-13: qty 2

## 2014-07-13 MED ORDER — CISATRACURIUM BESYLATE (PF) 10 MG/5ML IV SOLN
INTRAVENOUS | Status: DC | PRN
  Administered 2014-07-13: 6 mg via INTRAVENOUS

## 2014-07-13 MED ORDER — BUPIVACAINE-EPINEPHRINE 0.5% -1:200000 IJ SOLN
INTRAMUSCULAR | Status: DC | PRN
  Administered 2014-07-13: 20 mL

## 2014-07-13 MED ORDER — SODIUM CHLORIDE 0.9 % IR SOLN
Status: DC | PRN
  Administered 2014-07-13: 08:00:00

## 2014-07-13 MED ORDER — EPHEDRINE SULFATE 50 MG/ML IJ SOLN
INTRAMUSCULAR | Status: DC | PRN
  Administered 2014-07-13 (×3): 10 mg via INTRAVENOUS

## 2014-07-13 MED ORDER — CLINDAMYCIN PHOSPHATE 900 MG/50ML IV SOLN
INTRAVENOUS | Status: AC
  Filled 2014-07-13: qty 50

## 2014-07-13 MED ORDER — DEXAMETHASONE SODIUM PHOSPHATE 10 MG/ML IJ SOLN
INTRAMUSCULAR | Status: AC
  Filled 2014-07-13: qty 1

## 2014-07-13 MED ORDER — MIDAZOLAM HCL 5 MG/5ML IJ SOLN
INTRAMUSCULAR | Status: DC | PRN
  Administered 2014-07-13: 2 mg via INTRAVENOUS

## 2014-07-13 MED ORDER — EPHEDRINE SULFATE 50 MG/ML IJ SOLN
INTRAMUSCULAR | Status: AC
  Filled 2014-07-13: qty 1

## 2014-07-13 MED ORDER — KETAMINE HCL 10 MG/ML IJ SOLN
INTRAMUSCULAR | Status: AC
  Filled 2014-07-13: qty 1

## 2014-07-13 MED ORDER — DOCUSATE SODIUM 100 MG PO CAPS: 100.0000 mg | ORAL_CAPSULE | Freq: Two times a day (BID) | ORAL | Status: AC | PRN

## 2014-07-13 MED ORDER — PHENYLEPHRINE HCL 10 MG/ML IJ SOLN
INTRAMUSCULAR | Status: AC
  Filled 2014-07-13: qty 1

## 2014-07-13 MED ORDER — CLINDAMYCIN HCL 150 MG PO CAPS: 150.0000 mg | ORAL_CAPSULE | Freq: Four times a day (QID) | ORAL | Status: AC

## 2014-07-13 MED ORDER — DEXAMETHASONE SODIUM PHOSPHATE 10 MG/ML IJ SOLN
INTRAMUSCULAR | Status: DC | PRN
  Administered 2014-07-13: 10 mg via INTRAVENOUS

## 2014-07-13 MED ORDER — NEOSTIGMINE METHYLSULFATE 10 MG/10ML IV SOLN
INTRAVENOUS | Status: DC | PRN
  Administered 2014-07-13: 4 mg via INTRAVENOUS

## 2014-07-13 MED ORDER — CEFAZOLIN SODIUM-DEXTROSE 2-3 GM-% IV SOLR: 2.0000 g | INTRAVENOUS | Status: DC

## 2014-07-13 MED ORDER — ONDANSETRON HCL 4 MG/2ML IJ SOLN
INTRAMUSCULAR | Status: AC
  Filled 2014-07-13: qty 2

## 2014-07-13 MED ORDER — PROPOFOL 10 MG/ML IV BOLUS
INTRAVENOUS | Status: AC
  Filled 2014-07-13: qty 20

## 2014-07-13 SURGICAL SUPPLY — 49 items
ANCH SUT PUSHLCK 24X4.5 STRL (Orthopedic Implant) ×2 IMPLANT
ANCH SUT SWLK 19.1X4.75 VT (Anchor) ×2 IMPLANT
ANCHOR NDL 9/16 CIR SZ 8 (NEEDLE) IMPLANT
ANCHOR NEEDLE 9/16 CIR SZ 8 (NEEDLE) IMPLANT
ANCHOR PEEK 4.75X19.1 SWLK C (Anchor) ×4 IMPLANT
BAG SPEC THK2 15X12 ZIP CLS (MISCELLANEOUS)
BAG ZIPLOCK 12X15 (MISCELLANEOUS) IMPLANT
CHLORAPREP W/TINT 26ML (MISCELLANEOUS) ×2 IMPLANT
CLEANER TIP ELECTROSURG 2X2 (MISCELLANEOUS) ×3 IMPLANT
CLOSURE WOUND 1/2 X4 (GAUZE/BANDAGES/DRESSINGS) ×1
CLOTH 2% CHLOROHEXIDINE 3PK (PERSONAL CARE ITEMS) ×3 IMPLANT
DRAPE ORTHO SPLIT 77X108 STRL (DRAPES) ×3
DRAPE POUCH INSTRU U-SHP 10X18 (DRAPES) ×3 IMPLANT
DRAPE SURG ORHT 6 SPLT 77X108 (DRAPES) ×1 IMPLANT
DRSG AQUACEL AG ADV 3.5X 4 (GAUZE/BANDAGES/DRESSINGS) ×2 IMPLANT
DRSG AQUACEL AG ADV 3.5X 6 (GAUZE/BANDAGES/DRESSINGS) IMPLANT
DURAPREP 26ML APPLICATOR (WOUND CARE) ×1 IMPLANT
ELECT NDL TIP 2.8 STRL (NEEDLE) ×1 IMPLANT
ELECT NEEDLE TIP 2.8 STRL (NEEDLE) ×3 IMPLANT
ELECT REM PT RETURN 9FT ADLT (ELECTROSURGICAL) ×3
ELECTRODE REM PT RTRN 9FT ADLT (ELECTROSURGICAL) ×1 IMPLANT
GLOVE BIOGEL PI IND STRL 7.5 (GLOVE) ×1 IMPLANT
GLOVE BIOGEL PI IND STRL 8 (GLOVE) IMPLANT
GLOVE BIOGEL PI INDICATOR 7.5 (GLOVE) ×2
GLOVE BIOGEL PI INDICATOR 8 (GLOVE)
GLOVE SURG SS PI 7.5 STRL IVOR (GLOVE) ×3 IMPLANT
GLOVE SURG SS PI 8.0 STRL IVOR (GLOVE) ×6 IMPLANT
GOWN STRL REUS W/TWL XL LVL3 (GOWN DISPOSABLE) ×6 IMPLANT
KIT BASIN OR (CUSTOM PROCEDURE TRAY) ×3 IMPLANT
KIT POSITION SHOULDER SCHLEI (MISCELLANEOUS) ×3 IMPLANT
MANIFOLD NEPTUNE II (INSTRUMENTS) ×3 IMPLANT
NDL SCORPION MULTI FIRE (NEEDLE) IMPLANT
NEEDLE SCORPION MULTI FIRE (NEEDLE) IMPLANT
PACK SHOULDER CUSTOM OPM052 (CUSTOM PROCEDURE TRAY) ×3 IMPLANT
POSITIONER SURGICAL ARM (MISCELLANEOUS) ×3 IMPLANT
PUSHLOCK PEEK 4.5X24 (Orthopedic Implant) ×4 IMPLANT
SLING ARM IMMOBILIZER LRG (SOFTGOODS) IMPLANT
SLING ULTRA II L (ORTHOPEDIC SUPPLIES) ×2 IMPLANT
STRIP CLOSURE SKIN 1/2X4 (GAUZE/BANDAGES/DRESSINGS) ×2 IMPLANT
SUT BONE WAX W31G (SUTURE) IMPLANT
SUT ETHIBOND NAB CT1 #1 30IN (SUTURE) IMPLANT
SUT FIBERWIRE #2 38 T-5 BLUE (SUTURE)
SUT PROLENE 3 0 PS 2 (SUTURE) ×3 IMPLANT
SUT TIGER TAPE 7 IN WHITE (SUTURE) IMPLANT
SUT VIC AB 1-0 CT2 27 (SUTURE) ×5 IMPLANT
SUT VIC AB 2-0 CT2 27 (SUTURE) ×3 IMPLANT
SUT VICRYL 0-0 OS 2 NEEDLE (SUTURE) IMPLANT
SUTURE FIBERWR #2 38 T-5 BLUE (SUTURE) IMPLANT
TAPE FIBER 2MM 7IN #2 BLUE (SUTURE) IMPLANT

## 2014-07-13 NOTE — Op Note (Signed)
Ruth Long, FRITCHMAN                ACCOUNT NO.:  [id number]  MEDICAL RECORD NO.:  [id number]  LOCATION:  [facility]                         FACILITY:  [facility]  PHYSICIAN:  Susa Day, M.D.    DATE OF BIRTH:  1956-03-24  DATE OF PROCEDURE: DATE OF DISCHARGE:                              OPERATIVE REPORT   PREOPERATIVE DIAGNOSIS:  Rotator cuff tear, retracted massive.  POSTOPERATIVE DIAGNOSIS:  Rotator cuff tear, retracted massive.  PROCEDURES PERFORMED:  Mini-open rotator cuff repair utilizing PushLock and SwiveLock suture anchors.  ANESTHESIA:  General.  ASSISTANT:  Cleophas Dunker, PA.  HISTORY:  A 58 year old, retracted tear of the rotator cuff, indicated for repair.  Risk and benefits were discussed including bleeding, infection, suboptimal range of motion, re-tear, DVT, PE, anesthetic complications, etc.  TECHNIQUE:  With the patient supine in beach-chair position, after induction of adequate anesthesia and 600 clindamycin, the right shoulder and upper extremity was prepped and draped in the usual sterile fashion after examination revealed full range.  Small 2 cm incision was made over the anterolateral aspect of the acromion.  We infiltrated the tissue with 0.25% Marcaine with epinephrine.  We had a test dose of the anesthetic as the skin well in the arm without reaction.  Subcutaneous tissue was dissected.  Electrocautery was utilized to achieve hemostasis.  Raphe between the anterolateral heads were divided in line with the skin incision.  A self-retaining retractor was placed. Released the CA ligament and excised it.  We preserved the attachment of the deltoid.  The small spur was removed with a 3-mm Kerrison. Bursectomy was performed.  Retracted tear of the rotator cuff including the supraspinatus and infraspinatus were noted superiorly approximately 2.5 cm retracted.  Articular surface was exposed in a fashion and the trough in the bone with a Beyer rongeur to get  bleeding tissue.  We mobilized the tissue on top and bottom of the rotator cuff with a Cobb to the glenoid, we mobilized it satisfactory without undue tension.  I then used an awl and placed a pilot hole for both SwiveLocks in the cancellous bed approximately a centimeter and a half apart.  One posteriorly and 1 anteriorly.  I then placed a SwiveLock, threaded the fiber tape through that and then passed the fiber tape through the infraspinatus posteriorly and the supraspinatus medially and anteriorly. With multiple configurations, we found the best line of pull, we gently advanced that into the bed.  I then placed them into PushLocks over the lateral aspect of the greater tuberosity after fashioning the pilot holes with awl, again separated without undue tension advancing that covering the full defect into the cancellous bone without difficulty. Full coverage was noted anteriorly and posteriorly without undue tension.  This was advanced with the arm at that side.  Copiously irrigated the wound.  Electrocautery was utilized to achieve hemostasis. Marcaine with epinephrine was infiltrated in the deltoid area near the incision.  After we had excised the CA ligament, we repaired the raphe with 1 Vicryl, subcu with 2-0, and skin with Prolene.  Sterile dressing applied.  Placed in an abduction pillow, extubated without difficulty, and transported to the recovery room in satisfactory condition.  The patient tolerated the procedure well.  No complications.  Mininal blood loss.     Susa Day, M.D.     Geralynn Rile  D:  07/13/2014  T:  07/13/2014  Job:  [id number]

## 2014-07-13 NOTE — Brief Op Note (Signed)
07/13/2014  8:40 AM  PATIENT:  Ruth Long  58 y.o. female  PRE-OPERATIVE DIAGNOSIS:  RIGHT ROTATOR CUFF TEAR  POST-OPERATIVE DIAGNOSIS:  RIGHT ROTATOR CUFF TEAR  PROCEDURE:  Procedure(s): RIGHT SUBACROMIAL DECOMPRESSION/MINI OPEN ROTATOR CUFF REPAIR  (Right)  SURGEON:  Surgeon(s) and Role:    * Johnn Hai, MD - Primary  PHYSICIAN ASSISTANT:   ASSISTANTS: Bissell   ANESTHESIA:   general  EBL:  Total I/O In: -  Out: 25 [Blood:25]  BLOOD ADMINISTERED:none  DRAINS: none   LOCAL MEDICATIONS USED:  MARCAINE     SPECIMEN:  No Specimen  DISPOSITION OF SPECIMEN:  N/A  COUNTS:  YES  TOURNIQUET:  * No tourniquets in log *  DICTATION: .Other Dictation: Dictation Number [id number]  PLAN OF CARE: Discharge to home after PACU  PATIENT DISPOSITION:  PACU - hemodynamically stable.   Delay start of Pharmacological VTE agent (>24hrs) due to surgical blood loss or risk of bleeding: no

## 2014-07-13 NOTE — Anesthesia Postprocedure Evaluation (Signed)
  Anesthesia Post-op Note  Patient: Ruth Long  Procedure(s) Performed: Procedure(s) (LRB): RIGHT SUBACROMIAL DECOMPRESSION/MINI OPEN ROTATOR CUFF REPAIR  (Right)  Patient Location: PACU  Anesthesia Type: General  Level of Consciousness: awake and alert   Airway and Oxygen Therapy: Patient Spontanous Breathing  Post-op Pain: mild  Post-op Assessment: Post-op Vital signs reviewed, Patient's Cardiovascular Status Stable, Respiratory Function Stable, Patent Airway and No signs of Nausea or vomiting  Last Vitals:  Filed Vitals:   07/13/14 0915  BP: 145/72  Pulse: 93  Temp:   Resp: 16    Post-op Vital Signs: stable   Complications: No apparent anesthesia complications

## 2014-07-13 NOTE — Interval H&P Note (Signed)
History and Physical Interval Note:  07/13/2014 7:24 AM  Ruth Long  has presented today for surgery, with the diagnosis of RIGHT ROTATOR CUFF TEAR  The various methods of treatment have been discussed with the patient and family. After consideration of risks, benefits and other options for treatment, the patient has consented to  Procedure(s): RIGHT SUBACROMIAL DECOMPRESSION/MINI OPEN ROTATOR CUFF REPAIR  (Right) as a surgical intervention .  The patient's history has been reviewed, patient examined, no change in status, stable for surgery.  I have reviewed the patient's chart and labs.  Questions were answered to the patient's satisfaction.     Villa Burgin C

## 2014-07-13 NOTE — Progress Notes (Signed)
Dr. Landry Dyke in to see patient.  She knows to see her opthomologist if her eye vision doesn't improve. She states she feels like her vision is improving

## 2014-07-13 NOTE — Progress Notes (Signed)
C/O "blurry vision Lt eye. Pupils 35mm equal. Follows finger left to right and up and down. Vision clear Rt eye. No swelling or redness noted. Does not wear glasses.

## 2014-07-13 NOTE — Discharge Instructions (Signed)
Aquacel dressing may remain in place x 7 days. May shower with aquacel in place. After 7 days, remove aquacel, leave steri strips in place, and place a new dressing with gauze and tape or bandaids which should be kept clean and dry and changed daily. Use sling at times except when exercising or showering No driving for 4-6 weeks No lifting for 6 weeks operative arm Pendulum exercises as instructed. Ok to move wrist,elbow, and hand. See Dr. Tonita Cong in 10-14 days. Take one aspirin per day with a meal if not on a blood thinner or allergic to aspirin.

## 2014-07-13 NOTE — Anesthesia Procedure Notes (Signed)
Procedure Name: Intubation Date/Time: 07/13/2014 7:38 AM Performed by: Danley Danker L Patient Re-evaluated:Patient Re-evaluated prior to inductionOxygen Delivery Method: Circle system utilized Preoxygenation: Pre-oxygenation with 100% oxygen Intubation Type: IV induction Ventilation: Mask ventilation without difficulty and Oral airway inserted - appropriate to patient size Laryngoscope Size: Sabra Heck and 2 Grade View: Grade I Tube type: Oral Tube size: 7.5 mm Number of attempts: 1 Airway Equipment and Method: Stylet Placement Confirmation: ETT inserted through vocal cords under direct vision,  breath sounds checked- equal and bilateral and positive ETCO2 Secured at: 21 cm Tube secured with: Tape Dental Injury: Teeth and Oropharynx as per pre-operative assessment

## 2014-07-13 NOTE — Transfer of Care (Signed)
Immediate Anesthesia Transfer of Care Note  Patient: Ruth Long  Procedure(s) Performed: Procedure(s): RIGHT SUBACROMIAL DECOMPRESSION/MINI OPEN ROTATOR CUFF REPAIR  (Right)  Patient Location: PACU  Anesthesia Type:General  Level of Consciousness: sedated  Airway & Oxygen Therapy: Patient Spontanous Breathing and Patient connected to face mask oxygen  Post-op Assessment: Report given to PACU RN and Post -op Vital signs reviewed and stable  Post vital signs: Reviewed and stable  Complications: No apparent anesthesia complications

## 2014-07-13 NOTE — Progress Notes (Signed)
Patient continues to complain with blurred vision in Left eye. TraceyRN from PACU will ask Dr. Landry Dyke to come see patient re. this

## 2014-07-13 NOTE — H&P (View-Only) (Signed)
Ruth Long is an 58 y.o. female.   Chief Complaint: R shoulder pain HPI: The patient is a 58 year old female. They are right handed and present today reporting pain at the right shoulder that began 2 year(s) ago. The patient reports that the shoulder symptoms began following a specific injury. The injury resulted from [facility] a pop in shoulder while mowing the lawn that occurred at home 2 years. The patient reports symptoms which include shoulder pain, catching, decreased range of motion and night pain. There is no radiation of symptoms. The patient reports that these symptoms are present frequently (when she moves it). The patient describes these symptoms as moderate in severity (Severe when she lifts it) and worsening. Symptoms are exacerbated by motion at the shoulder, elevation of the shoulder and lifting. Associated symptoms include numbness in the arm (tingling). The patient was previously evaluated by a primary physician (Dr. Virl Cagey in [place]). Note for "Shoulder Complaints": Has pain medications that help some, but wants to stay off of those  No past medical history on file.  No past surgical history on file.  No family history on file. Social History:  has no tobacco, alcohol, and drug history on file.  Allergies: Allergies not on file   (Not in a hospital admission)  No results found for this or any previous visit (from the past 48 hour(s)). No results found.  Review of Systems  Constitutional: Negative.   HENT: Negative.   Eyes: Negative.   Respiratory: Negative.   Cardiovascular: Negative.   Gastrointestinal: Negative.   Genitourinary: Negative.   Musculoskeletal: Positive for joint pain.  Skin: Negative.   Neurological: Negative.   Psychiatric/Behavioral: Negative.     There were no vitals taken for this visit. Physical Exam  Constitutional: She is oriented to person, place, and time. She appears well-developed and well-nourished.  HENT:  Head: Normocephalic  and atraumatic.  Eyes: Conjunctivae and EOM are normal. Pupils are equal, round, and reactive to light.  Neck: Normal range of motion. Neck supple.  Cardiovascular: Normal rate and regular rhythm.   Respiratory: Effort normal and breath sounds normal.  GI: Soft. Bowel sounds are normal.  Musculoskeletal:  On exam positive impingement sign. Decreased internal rotation in abduction, weakness in external rotation. Tender in the anterior subacromial region. Negative crossover. Neurovascularly intact.    Inspection of the cervical spine reveals a normal lordosis without evidence of paraspinous spasms or soft tissue swelling. Nontender to palpation. Full flexion, full extension, full left and right lateral rotation. Extension combined with lateral flexion does not reproduce pain. Negative Tinel's at median and ulnar nerves at the elbow. Negative carpal compression test at the wrists. Motor of the upper extremities is 5/5 including biceps, triceps, brachioradialis,wrist flexion, wrist extension, finger flexion, finger extension. Reflexes are normoreflexic. Sensory exam is intact to light touch. There is no Hoffmann sign. Nontender over the thoracic spine.  Negative lift off. Negative Speeds test.  Neurological: She is alert and oriented to person, place, and time. She has normal reflexes.  Skin: Skin is warm and dry.  Psychiatric: She has a normal mood and affect.    Three view radiographs of the shoulder demonstrate type II acromion. Minimal AC arthrosis.  MRI arthrogram with full thickness retracted RCT  Assessment/Plan R shoulder RCT  Pt with pain for 2+ years R shoulder refractory to conservative tx with RCT on MR arthrogram. Dr. Tonita Cong discussed options with the patient, given duration of time, failed conservative tx and retracted tear, recommend  proceeding with R shoulder mini-open RCR/SAD. We discussed surgery itself as well as risks, complications and alternatives including but not  limited to DVT, PE, infx, bleeding, failure of procedure, need for secondary procedure, anesthesia risk, even death. Discussed post-op protocols, sling, PT. She desires to proceed. Follow up 10-14 days post-op for suture removal.  Plan R shoulder mini-open SAD/RCR  Vianne Grieshop M. PA-C for Dr. Tonita Cong 07/03/2014, 8:15 PM

## 2014-07-14 ENCOUNTER — Encounter: Payer: Self-pay | Admitting: Vascular Surgery

## 2014-07-14 ENCOUNTER — Encounter (HOSPITAL_COMMUNITY): Payer: Self-pay | Admitting: Specialist

## 2014-07-17 ENCOUNTER — Encounter: Payer: Self-pay | Admitting: Vascular Surgery

## 2014-07-17 ENCOUNTER — Ambulatory Visit (HOSPITAL_COMMUNITY)
Admission: RE | Admit: 2014-07-17 | Discharge: 2014-07-17 | Disposition: A | Discharge status: Home / Self Care | Payer: BC Managed Care – PPO | Source: Ambulatory Visit | Attending: Vascular Surgery | Admitting: Vascular Surgery

## 2014-07-17 ENCOUNTER — Ambulatory Visit (INDEPENDENT_AMBULATORY_CARE_PROVIDER_SITE_OTHER): Payer: Self-pay | Admitting: Vascular Surgery

## 2014-07-17 VITALS — BP 137/85 | HR 97 | Resp 16 | Ht 64.0 in | Wt 135.0 lb

## 2014-07-17 DIAGNOSIS — I83893 Varicose veins of bilateral lower extremities with other complications: Secondary | ICD-10-CM

## 2014-07-17 NOTE — Progress Notes (Signed)
   Pt referred to [place] for possible "additional vein work" for pain at rest behind R knee.  Pt s/p Prior R EVLA by outside practice in [place].  Reported pt was scheduled for additional work but [employer] has changed resulting in other practice being out of network.  Pt referred here to schedule surgery.  - Listening to the pt, I suspect her sx are not related to her venous disease.  I will defer to my partners in the [facility] the final evaluation. - Will directly book patient with Dr. Rosanne Sack for evaluation as pt has already met insurance requirement for evaluation for intervention - Due to the scheduling issue, I agreed with the patient to no charge today's visit.  Adele Barthel, MD [facility] Office: [phone] Pager: [phone]  07/17/2014, 1:14 PM

## 2014-07-21 ENCOUNTER — Encounter: Payer: BC Managed Care – PPO | Admitting: Vascular Surgery

## 2014-07-21 ENCOUNTER — Encounter (HOSPITAL_COMMUNITY): Payer: BC Managed Care – PPO

## 2014-07-31 ENCOUNTER — Encounter: Payer: Self-pay | Admitting: Vascular Surgery

## 2014-08-01 ENCOUNTER — Encounter: Payer: Self-pay | Admitting: Vascular Surgery

## 2014-08-01 ENCOUNTER — Ambulatory Visit (INDEPENDENT_AMBULATORY_CARE_PROVIDER_SITE_OTHER): Payer: BC Managed Care – PPO | Admitting: Vascular Surgery

## 2014-08-01 VITALS — BP 158/89 | HR 90 | Resp 18 | Ht 63.5 in | Wt 138.8 lb

## 2014-08-01 DIAGNOSIS — I83893 Varicose veins of bilateral lower extremities with other complications: Secondary | ICD-10-CM

## 2014-08-01 NOTE — Progress Notes (Signed)
Patient name: Ruth Long MRN: [id number] DOB: [birth year] Sex: female   Referred by: Unk Lightning  Reason for referral:  Chief Complaint  Patient presents with  . Follow-up    referred to vein Center by Dr. Bridgett Larsson  c/o right leg pain posterior calf, worse at night prevents her from sleeping   . Varicose Veins    HISTORY OF PRESENT ILLNESS: Patient is a for discussion of venous hypertension. She has an extensive past medical history. I do have her studies from an outlying vein center for review as well. She is status post ablation in the past of her left great and left small saphenous vein and also ablation of her right great saphenous vein. These were done in an outlying vein center in 2011 2012. She did report is difficulty with a heavy restless sensation in her right calf after prolonged standing. She was seen back in the vein center and had had small saphenous ablation recommended to her. There apparently was some question regarding out-of-network coverage and she is seeing [place] for second opinion and further discussion of potential treatment. Since her last evaluation at an outlying vein center he has had rotator cuff surgery her right arm. She reports that she has had resolution of the pain that she was experiencing in her right posterior calf. She does not have any history of DVT no history of bleeding or other complications from varicosities.  Past Medical History  Diagnosis Date  . PONV (postoperative nausea and vomiting)   . Hypertension   . Headache(784.0)     HX MIGRAINES  . RCT (rotator cuff tear)     RIGHT  . History of nonmelanoma skin cancer   . Esophageal spasm   . Elevated blood sugar   . Cancer     HX SKIN CANCER  . Varicose veins     Past Surgical History  Procedure Laterality Date  . Tonsillectomy  1962  . Appendectomy  1966  . Thyroidectomy, partial  1994  . Bunionectomy  1997    BILATERAL  . Knee arthrotomy  1996    LEFT  . Cholecystectomy  2004  .  Abdominal hysterectomy  2004  . Shoulder open rotator cuff repair Right 07/13/2014    Procedure: RIGHT SUBACROMIAL DECOMPRESSION/MINI OPEN ROTATOR CUFF REPAIR ;  Surgeon: Johnn Hai, MD;  Location: [facility];  Service: Orthopedics;  Laterality: Right;    History   Social History  . Marital Status: Married    Spouse Name: N/A    Number of Children: N/A  . Years of Education: N/A   Occupational History  . Not on file.   Social History Main Topics  . Smoking status: Never Smoker   . Smokeless tobacco: Never Used  . Alcohol Use: No  . Drug Use: No  . Sexual Activity: Not on file   Other Topics Concern  . Not on file   Social History Narrative  . No narrative on file    Family History  Problem Relation Age of Onset  . Cancer Mother   . Varicose Veins Mother   . Cancer Father     brain    Allergies as of 08/01/2014 - Review Complete 08/01/2014  Allergen Reaction Noted  . Iodine Swelling 07/06/2014  . Penicillins Swelling 07/06/2014  . Shellfish allergy Swelling 07/06/2014  . Lidocaine Rash 07/11/2014  . Sotradecol [sodium tetradecyl sulfate] Rash 07/11/2014    Current Outpatient Prescriptions on File Prior to Visit  Medication Sig Dispense  Refill  . azelastine (ASTELIN) 0.1 % nasal spray       . beta carotene w/minerals (OCUVITE) tablet Take 1 tablet by mouth daily.      . calcium-vitamin D (OSCAL WITH D) 500-200 MG-UNIT per tablet Take 1 tablet by mouth 2 (two) times daily.      . fenofibrate 160 MG tablet Take 160 mg by mouth every morning.      . Flaxseed, Linseed, (FLAX SEED OIL) 1000 MG CAPS Take 1 capsule by mouth 2 (two) times daily.      Marland Kitchen omeprazole (PRILOSEC OTC) 20 MG tablet Take 20 mg by mouth 2 (two) times daily.      . Potassium Gluconate 550 MG TABS Take 1 tablet by mouth daily.      . valsartan-hydrochlorothiazide (DIOVAN-HCT) 320-25 MG per tablet Take 1 tablet by mouth every morning.      . clindamycin (CLEOCIN) 150 MG capsule       . docusate  sodium (COLACE) 100 MG capsule Take 1 capsule (100 mg total) by mouth 2 (two) times daily as needed for mild constipation.  20 capsule  1  . ketorolac (TORADOL) 10 MG tablet Take 1 tablet (10 mg total) by mouth every 6 (six) hours as needed.  20 tablet  0  . methocarbamol (ROBAXIN) 500 MG tablet Take 1 tablet (500 mg total) by mouth 3 (three) times daily.  60 tablet  1  . oxyCODONE-acetaminophen (PERCOCET) 5-325 MG per tablet Take 1 tablet by mouth every 4 (four) hours as needed.  50 tablet  0   No current facility-administered medications on file prior to visit.     REVIEW OF SYSTEMS:  Positives indicated with an "X"  CARDIOVASCULAR:  [ ]  chest pain   [ ]  chest pressure   [ ]  palpitations   [ ]  orthopnea   [ ]  dyspnea on exertion   [ ]  claudication   [ ]  rest pain   [ ]  DVT   [ ]  phlebitis PULMONARY:   [ ]  productive cough   [ ]  asthma   [ ]  wheezing NEUROLOGIC:   [ ]  weakness  [ ]  paresthesias  [ ]  aphasia  [ ]  amaurosis  [ ]  dizziness HEMATOLOGIC:   [ ]  bleeding problems   [ ]  clotting disorders MUSCULOSKELETAL:  [ ]  joint pain   [ ]  joint swelling GASTROINTESTINAL: [ ]   blood in stool  [ ]   hematemesis GENITOURINARY:  [ ]   dysuria  [ ]   hematuria PSYCHIATRIC:  [ ]  history of major depression INTEGUMENTARY:  [ ]  rashes  [ ]  ulcers CONSTITUTIONAL:  [ ]  fever   [ ]  chills  PHYSICAL EXAMINATION:  General: The patient is a well-nourished female, in no acute distress. Vital signs are BP 158/89  Pulse 90  Resp 18  Ht 5' 3.5" (1.613 m)  Wt 138 lb 12.8 oz (62.959 kg)  BMI 24.20 kg/m2 Pulmonary: There is a good air exchange  . Musculoskeletal: There are no major deformities.  There is no significant extremity pain. Neurologic: No focal weakness or paresthesias are detected, Skin: There are no ulcer or rashes noted. No evidence of hemosiderin deposits Psychiatric: The patient has normal affect. Cardiovascular: Will dorsalis pedis pulses bilaterally She does have several small  telangiectasia and reticular veins in her right posterior A large varices.   [facility] Vascular Lab Studies:  I did review her study in our office from 07/18/1999 obtained. This does show reflux in her small saphenous  vein on the right. Diameter is 0.38 mm in maximal diameter.  I imaged this area with SonoSite ultrasound myself and there is some dilatation of her saphenous vein in the posterior calf  Impression and Plan:  Patient has had prior extensive treatment an outlying center of her left great and small saphenous and also left great saphenous vein. She reports that she did have a disc comfort and achiness that was relieved on the left side with these treatments. I explained that it is difficult to to be confident that her symptoms of her right calf discomfort at rest are related to the small saphenous vein reflux. The vein is not particularly dilated she does not have any large varicosities in her posterior right calf. Tortuous she's had complete resolution of the symptoms that at least temporarily. I explained we could proceed with the ablation of her right small saphenous vein but feel very comfortable with watching this conservatively and determining whether treatment is needed if she has recurrent of her discomfort. I explained this would be to possible in the future and the infant was years in the future this would not preclude her from having treatment if she does have recurrence of symptoms. She is comfortable with this conservative approach and will notify should she wish to proceed.    Joana Nolton [facility] Office: [phone]

## 2015-01-24 ENCOUNTER — Other Ambulatory Visit: Payer: Self-pay

## 2015-01-24 DIAGNOSIS — Z1231 Encounter for screening mammogram for malignant neoplasm of breast: Secondary | ICD-10-CM

## 2015-02-16 ENCOUNTER — Ambulatory Visit
Admission: RE | Admit: 2015-02-16 | Discharge: 2015-02-16 | Disposition: A | Discharge status: Home / Self Care | Payer: BC Managed Care – PPO | Source: Ambulatory Visit

## 2015-02-16 DIAGNOSIS — Z1231 Encounter for screening mammogram for malignant neoplasm of breast: Secondary | ICD-10-CM

## 2015-04-25 ENCOUNTER — Telehealth: Payer: Self-pay | Admitting: *Deleted

## 2015-04-25 NOTE — Telephone Encounter (Signed)
Returning Mrs. Ruth Long's call regarding increasing right posterior calf pain and setting up an appointment with Dr. Donnetta Hutching.  Ruth Long was last seen by Dr. Donnetta Hutching on 08-01-2014 with right posterior calf pain and reflux in the right small saphenous vein was demonstrated on venous reflux study of right leg on 07-17-2014. She had recently undergone rotator cuff surgery, so she and Dr. Donnetta Hutching decided for her to continue conservative treatment (she has been wearing compression hose since 07-2014) and when pain and symptoms intensified to return to [facility] for re-evaluation by Dr. Donnetta Hutching.  Ruth Long states that recently she has been experiencing increased pain in her right posterior calf and that it is waking her up at night.  Informed Ruth Long that she would need an appointment to see Dr Early since her last visit was 08-01-2014.  Discussed with her the need to review her chart with Dr. Donnetta Hutching tomorrow to determine if a reflux study of her right leg was needed in conjunction with the appointment. [facility] scheduler Hinton Dyer) will call Ruth Long back tomorrow (04-26-2015) to set up an appointment after Dr. Donnetta Hutching decides if reflux study of right leg is needed.  Ruth Long agrees with this plan of action.

## 2015-04-25 NOTE — Telephone Encounter (Signed)
Waiting on [facility]'s determination of need of vascular lab prior to OV

## 2015-05-15 ENCOUNTER — Ambulatory Visit: Payer: BC Managed Care – PPO | Admitting: Vascular Surgery

## 2015-05-18 ENCOUNTER — Encounter: Payer: Self-pay | Admitting: Vascular Surgery

## 2015-05-22 ENCOUNTER — Encounter: Payer: Self-pay | Admitting: Vascular Surgery

## 2015-05-22 ENCOUNTER — Ambulatory Visit (INDEPENDENT_AMBULATORY_CARE_PROVIDER_SITE_OTHER): Payer: BC Managed Care – PPO | Admitting: Vascular Surgery

## 2015-05-22 VITALS — BP 124/81 | HR 84 | Resp 14 | Ht 62.5 in | Wt 135.0 lb

## 2015-05-22 DIAGNOSIS — I83811 Varicose veins of right lower extremities with pain: Secondary | ICD-10-CM | POA: Diagnosis not present

## 2015-05-22 NOTE — Progress Notes (Signed)
The patient resents today for continued discussion of venous hypertension in her right leg. [place] and 15. She had extensive prior treatment in both legs that an outlying vein Center with ablation of both great saphenous veins and left small saphenous vein. She denies any evidence of venous ulcer disease and does not have any specific swelling. She reports that if she is quite active she can have some aching in her right calf at nighttime. She wears compression on an as-needed basis. She does not have any varicosities.  Past Medical History  Diagnosis Date  . PONV (postoperative nausea and vomiting)   . Hypertension   . Headache(784.0)     HX MIGRAINES  . RCT (rotator cuff tear)     RIGHT  . History of nonmelanoma skin cancer   . Esophageal spasm   . Elevated blood sugar   . Cancer     HX SKIN CANCER  . Varicose veins     History  Substance Use Topics  . Smoking status: Never Smoker   . Smokeless tobacco: Never Used  . Alcohol Use: No    Family History  Problem Relation Age of Onset  . Cancer Mother   . Varicose Veins Mother   . Cancer Father     brain    Allergies  Allergen Reactions  . Iodine Swelling  . Penicillins Swelling  . Shellfish Allergy Swelling  . Lidocaine Rash  . Sotradecol [Sodium Tetradecyl Sulfate] Rash     Current outpatient prescriptions:  .  azelastine (ASTELIN) 0.1 % nasal spray, , Disp: , Rfl:  .  beta carotene w/minerals (OCUVITE) tablet, Take 1 tablet by mouth daily., Disp: , Rfl:  .  calcium-vitamin D (OSCAL WITH D) 500-200 MG-UNIT per tablet, Take 1 tablet by mouth 2 (two) times daily., Disp: , Rfl:  .  docusate sodium (COLACE) 100 MG capsule, Take 1 capsule (100 mg total) by mouth 2 (two) times daily as needed for mild constipation., Disp: 20 capsule, Rfl: 1 .  fenofibrate 160 MG tablet, Take 160 mg by mouth every morning., Disp: , Rfl:  .  Flaxseed, Linseed, (FLAX SEED OIL) 1000 MG CAPS, Take 1 capsule by mouth 2 (two) times daily., Disp:  , Rfl:  .  omeprazole (PRILOSEC OTC) 20 MG tablet, Take 20 mg by mouth 2 (two) times daily., Disp: , Rfl:  .  valsartan-hydrochlorothiazide (DIOVAN-HCT) 320-25 MG per tablet, Take 1 tablet by mouth every morning., Disp: , Rfl:  .  clindamycin (CLEOCIN) 150 MG capsule, , Disp: , Rfl:  .  ketorolac (TORADOL) 10 MG tablet, Take 1 tablet (10 mg total) by mouth every 6 (six) hours as needed. (Patient not taking: Reported on 05/22/2015), Disp: 20 tablet, Rfl: 0 .  methocarbamol (ROBAXIN) 500 MG tablet, Take 1 tablet (500 mg total) by mouth 3 (three) times daily. (Patient not taking: Reported on 05/22/2015), Disp: 60 tablet, Rfl: 1 .  oxyCODONE-acetaminophen (PERCOCET) 5-325 MG per tablet, Take 1 tablet by mouth every 4 (four) hours as needed. (Patient not taking: Reported on 05/22/2015), Disp: 50 tablet, Rfl: 0 .  Potassium Gluconate 550 MG TABS, Take 1 tablet by mouth daily., Disp: , Rfl:   Filed Vitals:   05/22/15 0931  BP: 124/81  Pulse: 84  Resp: 14  Height: 5' 2.5" (1.588 m)  Weight: 135 lb (61.236 kg)    Body mass index is 24.28 kg/(m^2).       On physical exam: She does not have any evidence of venous hypertension in  her right or left leg. Does not have any swelling bilaterally. Does not have any telangiectasia or varicosities bilaterally. 2+ dorsalis pedis pulse in her right leg.  I reimage her small saphenous vein with SonoSite ultrasound. This has no change from her formal duplex in July 2015. She does have some very small caliber of her small saphenous vein from its origin to mid calf. There is slight dilatation with some tributary branches from her mid calf distally. Maximal diameter is between 3 mm.  I discussed this at length with the patient. I explained I do not feel that this is causing her discomfort. With no significant dilatation of the small saphenous vein, I feel that there is very little chance that ablation would improve her symptoms. I would recommend continued  observation only. I explained that there would be very little downside of proceeding with ablation feel that this has minimal chance of improving her symptoms. She is comfortable with continued observation. Will see [place] on as-needed basis

## 2015-12-18 ENCOUNTER — Other Ambulatory Visit: Payer: Self-pay

## 2016-01-30 ENCOUNTER — Other Ambulatory Visit: Payer: Self-pay

## 2016-01-30 DIAGNOSIS — Z1231 Encounter for screening mammogram for malignant neoplasm of breast: Secondary | ICD-10-CM

## 2016-02-25 ENCOUNTER — Ambulatory Visit
Admission: RE | Admit: 2016-02-25 | Discharge: 2016-02-25 | Disposition: A | Discharge status: Home / Self Care | Payer: BC Managed Care – PPO | Source: Ambulatory Visit

## 2016-02-25 DIAGNOSIS — Z1231 Encounter for screening mammogram for malignant neoplasm of breast: Secondary | ICD-10-CM

## 2016-02-28 ENCOUNTER — Other Ambulatory Visit: Payer: Self-pay | Admitting: Obstetrics and Gynecology

## 2016-02-28 DIAGNOSIS — R928 Other abnormal and inconclusive findings on diagnostic imaging of breast: Secondary | ICD-10-CM

## 2016-03-04 ENCOUNTER — Ambulatory Visit
Admission: RE | Admit: 2016-03-04 | Discharge: 2016-03-04 | Disposition: A | Discharge status: Home / Self Care | Payer: BC Managed Care – PPO | Source: Ambulatory Visit | Attending: Obstetrics and Gynecology | Admitting: Obstetrics and Gynecology

## 2016-03-04 ENCOUNTER — Other Ambulatory Visit: Payer: Self-pay | Admitting: Obstetrics and Gynecology

## 2016-03-04 DIAGNOSIS — R928 Other abnormal and inconclusive findings on diagnostic imaging of breast: Secondary | ICD-10-CM

## 2016-03-04 DIAGNOSIS — N6310 Unspecified lump in the right breast, unspecified quadrant: Secondary | ICD-10-CM

## 2016-03-06 ENCOUNTER — Other Ambulatory Visit: Payer: Self-pay | Admitting: Obstetrics and Gynecology

## 2016-03-06 DIAGNOSIS — N6310 Unspecified lump in the right breast, unspecified quadrant: Secondary | ICD-10-CM

## 2016-03-07 ENCOUNTER — Other Ambulatory Visit: Payer: Self-pay | Admitting: Obstetrics and Gynecology

## 2016-03-07 ENCOUNTER — Ambulatory Visit
Admission: RE | Admit: 2016-03-07 | Discharge: 2016-03-07 | Disposition: A | Discharge status: Home / Self Care | Payer: BC Managed Care – PPO | Source: Ambulatory Visit | Attending: Obstetrics and Gynecology | Admitting: Obstetrics and Gynecology

## 2016-03-07 DIAGNOSIS — N6310 Unspecified lump in the right breast, unspecified quadrant: Secondary | ICD-10-CM

## 2016-08-01 ENCOUNTER — Other Ambulatory Visit: Payer: Self-pay | Admitting: Obstetrics and Gynecology

## 2016-08-01 DIAGNOSIS — N6489 Other specified disorders of breast: Secondary | ICD-10-CM

## 2016-09-08 ENCOUNTER — Ambulatory Visit
Admission: RE | Admit: 2016-09-08 | Discharge: 2016-09-08 | Disposition: A | Discharge status: Home / Self Care | Payer: BC Managed Care – PPO | Source: Ambulatory Visit | Attending: Obstetrics and Gynecology | Admitting: Obstetrics and Gynecology

## 2016-09-08 DIAGNOSIS — N6489 Other specified disorders of breast: Secondary | ICD-10-CM

## 2017-02-03 ENCOUNTER — Other Ambulatory Visit: Payer: Self-pay | Admitting: Obstetrics and Gynecology

## 2017-02-03 DIAGNOSIS — N6489 Other specified disorders of breast: Secondary | ICD-10-CM

## 2017-03-10 ENCOUNTER — Ambulatory Visit
Admission: RE | Admit: 2017-03-10 | Discharge: 2017-03-10 | Disposition: A | Discharge status: Home / Self Care | Payer: BC Managed Care – PPO | Source: Ambulatory Visit | Attending: Obstetrics and Gynecology | Admitting: Obstetrics and Gynecology

## 2017-03-10 DIAGNOSIS — N6489 Other specified disorders of breast: Secondary | ICD-10-CM

## 2018-01-29 ENCOUNTER — Other Ambulatory Visit: Payer: Self-pay | Admitting: Obstetrics and Gynecology

## 2018-01-29 DIAGNOSIS — Z139 Encounter for screening, unspecified: Secondary | ICD-10-CM

## 2018-03-11 ENCOUNTER — Ambulatory Visit
Admission: RE | Admit: 2018-03-11 | Discharge: 2018-03-11 | Disposition: A | Discharge status: Home / Self Care | Payer: BC Managed Care – PPO | Source: Ambulatory Visit | Attending: Obstetrics and Gynecology | Admitting: Obstetrics and Gynecology

## 2018-03-11 DIAGNOSIS — Z139 Encounter for screening, unspecified: Secondary | ICD-10-CM

## 2019-02-03 ENCOUNTER — Other Ambulatory Visit: Payer: Self-pay | Admitting: Obstetrics and Gynecology

## 2019-02-03 DIAGNOSIS — Z1231 Encounter for screening mammogram for malignant neoplasm of breast: Secondary | ICD-10-CM

## 2019-03-14 ENCOUNTER — Ambulatory Visit: Payer: BC Managed Care – PPO

## 2019-04-25 ENCOUNTER — Ambulatory Visit: Payer: BC Managed Care – PPO

## 2019-06-06 ENCOUNTER — Other Ambulatory Visit: Payer: Self-pay

## 2019-06-06 ENCOUNTER — Ambulatory Visit
Admission: RE | Admit: 2019-06-06 | Discharge: 2019-06-06 | Disposition: A | Discharge status: Home / Self Care | Payer: BC Managed Care – PPO | Source: Ambulatory Visit | Attending: Obstetrics and Gynecology | Admitting: Obstetrics and Gynecology

## 2019-06-06 DIAGNOSIS — Z1231 Encounter for screening mammogram for malignant neoplasm of breast: Secondary | ICD-10-CM

## 2019-08-18 ENCOUNTER — Other Ambulatory Visit: Payer: Self-pay | Admitting: General Practice

## 2019-08-18 DIAGNOSIS — R234 Changes in skin texture: Secondary | ICD-10-CM

## 2019-08-18 DIAGNOSIS — L539 Erythematous condition, unspecified: Secondary | ICD-10-CM

## 2019-08-23 ENCOUNTER — Other Ambulatory Visit: Payer: Self-pay | Admitting: General Practice

## 2019-08-23 DIAGNOSIS — R234 Changes in skin texture: Secondary | ICD-10-CM

## 2019-08-23 DIAGNOSIS — L539 Erythematous condition, unspecified: Secondary | ICD-10-CM

## 2019-08-25 ENCOUNTER — Other Ambulatory Visit: Payer: BC Managed Care – PPO

## 2019-08-25 ENCOUNTER — Ambulatory Visit
Admission: RE | Admit: 2019-08-25 | Discharge: 2019-08-25 | Disposition: A | Discharge status: Home / Self Care | Payer: BC Managed Care – PPO | Source: Ambulatory Visit | Attending: General Practice | Admitting: General Practice

## 2019-08-25 ENCOUNTER — Other Ambulatory Visit: Payer: Self-pay

## 2019-08-25 DIAGNOSIS — R234 Changes in skin texture: Secondary | ICD-10-CM

## 2019-08-25 DIAGNOSIS — L539 Erythematous condition, unspecified: Secondary | ICD-10-CM

## 2019-12-01 DIAGNOSIS — Z0000 Encounter for general adult medical examination without abnormal findings: Secondary | ICD-10-CM | POA: Diagnosis not present

## 2019-12-01 DIAGNOSIS — Z7984 Long term (current) use of oral hypoglycemic drugs: Secondary | ICD-10-CM | POA: Diagnosis not present

## 2019-12-01 DIAGNOSIS — K219 Gastro-esophageal reflux disease without esophagitis: Secondary | ICD-10-CM | POA: Diagnosis not present

## 2019-12-01 DIAGNOSIS — E049 Nontoxic goiter, unspecified: Secondary | ICD-10-CM | POA: Diagnosis not present

## 2019-12-01 DIAGNOSIS — E782 Mixed hyperlipidemia: Secondary | ICD-10-CM | POA: Diagnosis not present

## 2019-12-01 DIAGNOSIS — I10 Essential (primary) hypertension: Secondary | ICD-10-CM | POA: Diagnosis not present

## 2019-12-01 DIAGNOSIS — E1165 Type 2 diabetes mellitus with hyperglycemia: Secondary | ICD-10-CM | POA: Diagnosis not present

## 2020-01-10 DIAGNOSIS — U071 COVID-19: Secondary | ICD-10-CM | POA: Diagnosis not present

## 2020-03-13 DIAGNOSIS — H43811 Vitreous degeneration, right eye: Secondary | ICD-10-CM | POA: Diagnosis not present

## 2020-03-13 DIAGNOSIS — H43393 Other vitreous opacities, bilateral: Secondary | ICD-10-CM | POA: Diagnosis not present

## 2020-04-10 DIAGNOSIS — H43393 Other vitreous opacities, bilateral: Secondary | ICD-10-CM | POA: Diagnosis not present

## 2020-04-10 DIAGNOSIS — H43811 Vitreous degeneration, right eye: Secondary | ICD-10-CM | POA: Diagnosis not present

## 2020-05-04 ENCOUNTER — Other Ambulatory Visit: Payer: Self-pay | Admitting: Family Medicine

## 2020-05-04 DIAGNOSIS — Z1231 Encounter for screening mammogram for malignant neoplasm of breast: Secondary | ICD-10-CM

## 2020-06-06 ENCOUNTER — Ambulatory Visit
Admission: RE | Admit: 2020-06-06 | Discharge: 2020-06-06 | Disposition: A | Discharge status: Home / Self Care | Payer: PPO | Source: Ambulatory Visit | Attending: Family Medicine | Admitting: Family Medicine

## 2020-06-06 ENCOUNTER — Other Ambulatory Visit: Payer: Self-pay

## 2020-06-06 DIAGNOSIS — Z1231 Encounter for screening mammogram for malignant neoplasm of breast: Secondary | ICD-10-CM

## 2020-06-18 DIAGNOSIS — J0100 Acute maxillary sinusitis, unspecified: Secondary | ICD-10-CM | POA: Diagnosis not present

## 2020-06-18 DIAGNOSIS — R3989 Other symptoms and signs involving the genitourinary system: Secondary | ICD-10-CM | POA: Diagnosis not present

## 2020-07-10 DIAGNOSIS — J40 Bronchitis, not specified as acute or chronic: Secondary | ICD-10-CM | POA: Diagnosis not present

## 2020-07-14 DIAGNOSIS — Z888 Allergy status to other drugs, medicaments and biological substances status: Secondary | ICD-10-CM | POA: Diagnosis not present

## 2020-07-14 DIAGNOSIS — G8911 Acute pain due to trauma: Secondary | ICD-10-CM | POA: Diagnosis not present

## 2020-07-14 DIAGNOSIS — S8002XA Contusion of left knee, initial encounter: Secondary | ICD-10-CM | POA: Diagnosis not present

## 2020-07-14 DIAGNOSIS — Z880 Allergy status to penicillin: Secondary | ICD-10-CM | POA: Diagnosis not present

## 2020-07-14 DIAGNOSIS — S8012XA Contusion of left lower leg, initial encounter: Secondary | ICD-10-CM | POA: Diagnosis not present

## 2020-07-14 DIAGNOSIS — S8992XA Unspecified injury of left lower leg, initial encounter: Secondary | ICD-10-CM | POA: Diagnosis not present

## 2020-07-14 DIAGNOSIS — M1712 Unilateral primary osteoarthritis, left knee: Secondary | ICD-10-CM | POA: Diagnosis not present

## 2020-07-20 DIAGNOSIS — M25469 Effusion, unspecified knee: Secondary | ICD-10-CM | POA: Diagnosis not present

## 2020-07-20 DIAGNOSIS — M25562 Pain in left knee: Secondary | ICD-10-CM | POA: Diagnosis not present

## 2020-07-20 DIAGNOSIS — M1712 Unilateral primary osteoarthritis, left knee: Secondary | ICD-10-CM | POA: Diagnosis not present

## 2020-08-01 DIAGNOSIS — M25562 Pain in left knee: Secondary | ICD-10-CM | POA: Diagnosis not present

## 2020-09-05 DIAGNOSIS — Z01419 Encounter for gynecological examination (general) (routine) without abnormal findings: Secondary | ICD-10-CM | POA: Diagnosis not present

## 2020-09-05 DIAGNOSIS — E041 Nontoxic single thyroid nodule: Secondary | ICD-10-CM | POA: Diagnosis not present

## 2020-09-14 DIAGNOSIS — D1801 Hemangioma of skin and subcutaneous tissue: Secondary | ICD-10-CM | POA: Diagnosis not present

## 2020-09-14 DIAGNOSIS — L820 Inflamed seborrheic keratosis: Secondary | ICD-10-CM | POA: Diagnosis not present

## 2020-09-14 DIAGNOSIS — L821 Other seborrheic keratosis: Secondary | ICD-10-CM | POA: Diagnosis not present

## 2020-09-14 DIAGNOSIS — L814 Other melanin hyperpigmentation: Secondary | ICD-10-CM | POA: Diagnosis not present

## 2020-09-14 DIAGNOSIS — L905 Scar conditions and fibrosis of skin: Secondary | ICD-10-CM | POA: Diagnosis not present

## 2020-09-14 DIAGNOSIS — Z85828 Personal history of other malignant neoplasm of skin: Secondary | ICD-10-CM | POA: Diagnosis not present

## 2020-09-14 DIAGNOSIS — D225 Melanocytic nevi of trunk: Secondary | ICD-10-CM | POA: Diagnosis not present

## 2020-10-02 DIAGNOSIS — Z23 Encounter for immunization: Secondary | ICD-10-CM | POA: Diagnosis not present

## 2020-12-04 DIAGNOSIS — I129 Hypertensive chronic kidney disease with stage 1 through stage 4 chronic kidney disease, or unspecified chronic kidney disease: Secondary | ICD-10-CM | POA: Diagnosis not present

## 2020-12-04 DIAGNOSIS — E042 Nontoxic multinodular goiter: Secondary | ICD-10-CM | POA: Diagnosis not present

## 2020-12-04 DIAGNOSIS — E782 Mixed hyperlipidemia: Secondary | ICD-10-CM | POA: Diagnosis not present

## 2020-12-04 DIAGNOSIS — K219 Gastro-esophageal reflux disease without esophagitis: Secondary | ICD-10-CM | POA: Diagnosis not present

## 2020-12-04 DIAGNOSIS — N1831 Chronic kidney disease, stage 3a: Secondary | ICD-10-CM | POA: Diagnosis not present

## 2020-12-04 DIAGNOSIS — E1165 Type 2 diabetes mellitus with hyperglycemia: Secondary | ICD-10-CM | POA: Diagnosis not present

## 2020-12-04 DIAGNOSIS — I10 Essential (primary) hypertension: Secondary | ICD-10-CM | POA: Diagnosis not present

## 2020-12-04 DIAGNOSIS — Z0000 Encounter for general adult medical examination without abnormal findings: Secondary | ICD-10-CM | POA: Diagnosis not present

## 2021-02-05 DIAGNOSIS — J40 Bronchitis, not specified as acute or chronic: Secondary | ICD-10-CM | POA: Diagnosis not present

## 2021-02-05 DIAGNOSIS — R42 Dizziness and giddiness: Secondary | ICD-10-CM | POA: Diagnosis not present

## 2021-03-18 IMAGING — MG DIGITAL SCREENING BILATERAL MAMMOGRAM WITH TOMO AND CAD
8 series · 8 of 24 positions shown · non-contrast
Comparison: Previous exam(s).

CLINICAL DATA: Screening.

EXAM:
DIGITAL SCREENING BILATERAL MAMMOGRAM WITH TOMO AND CAD

[R MLO synth-2D]
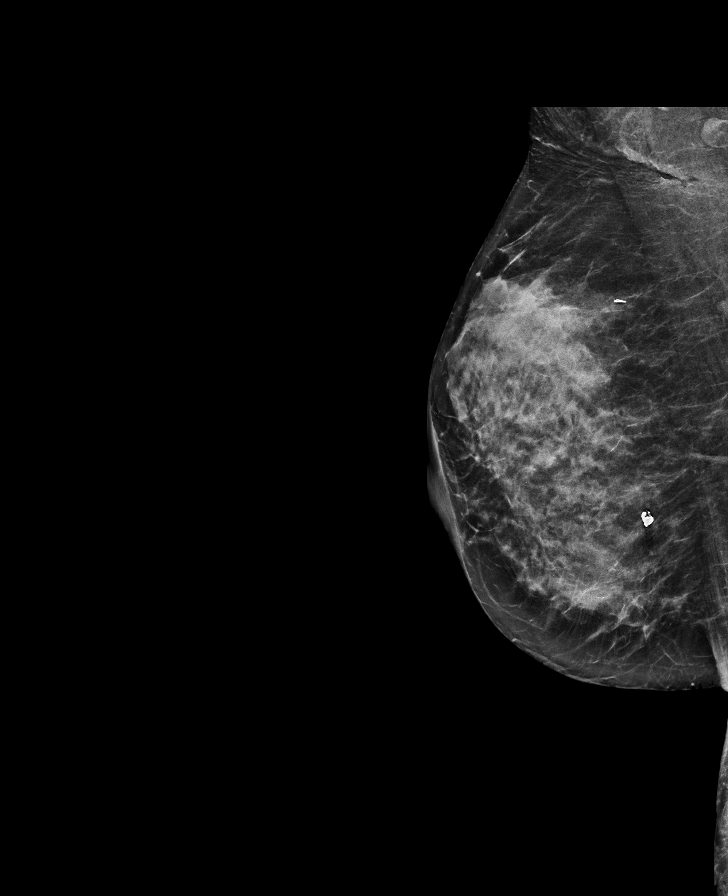

[L CC synth-2D]
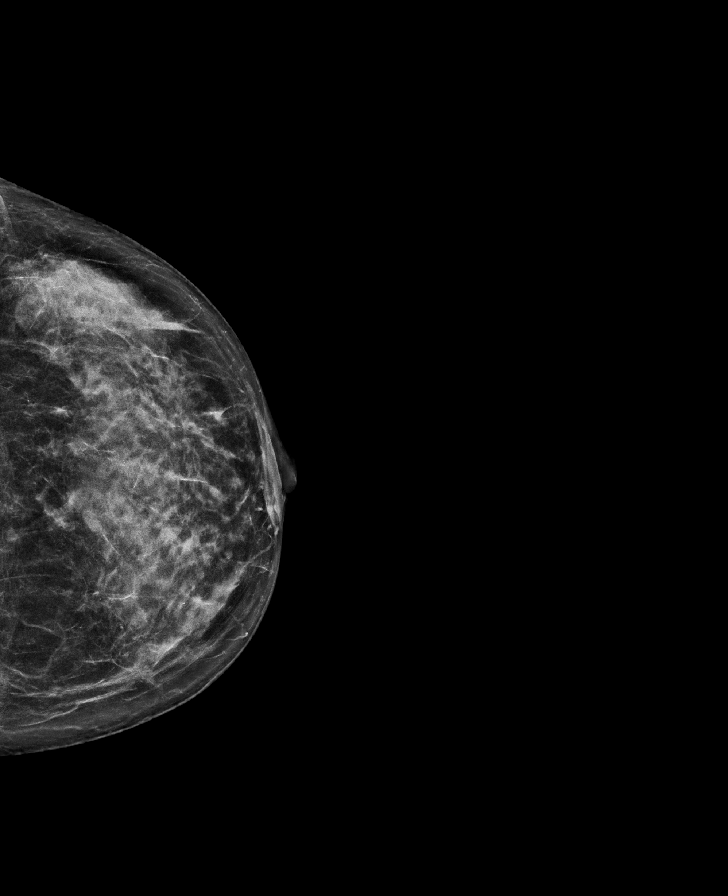

[L MLO synth-2D]
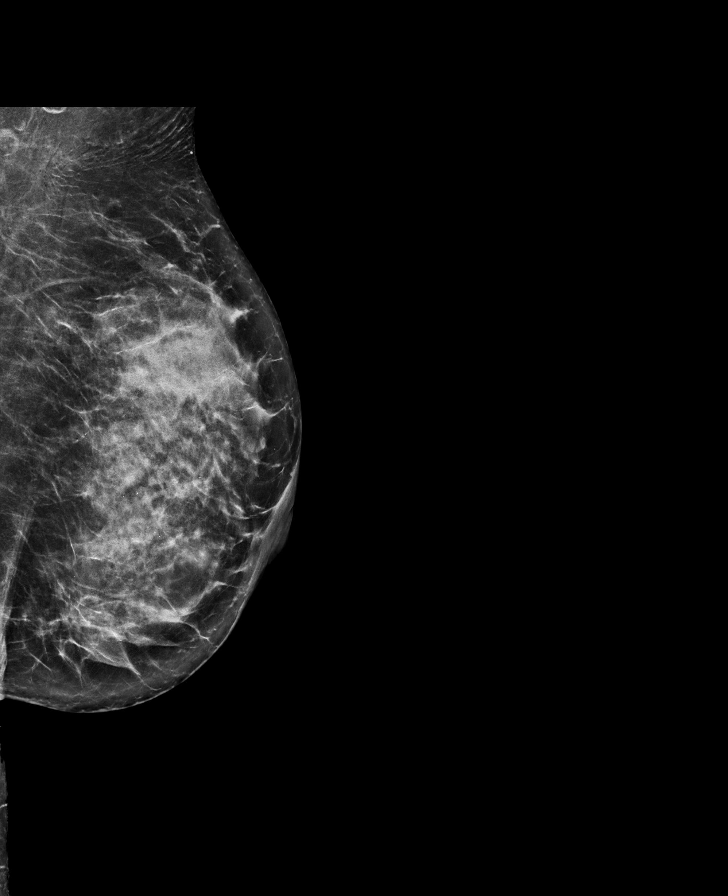

[R CC synth-2D]
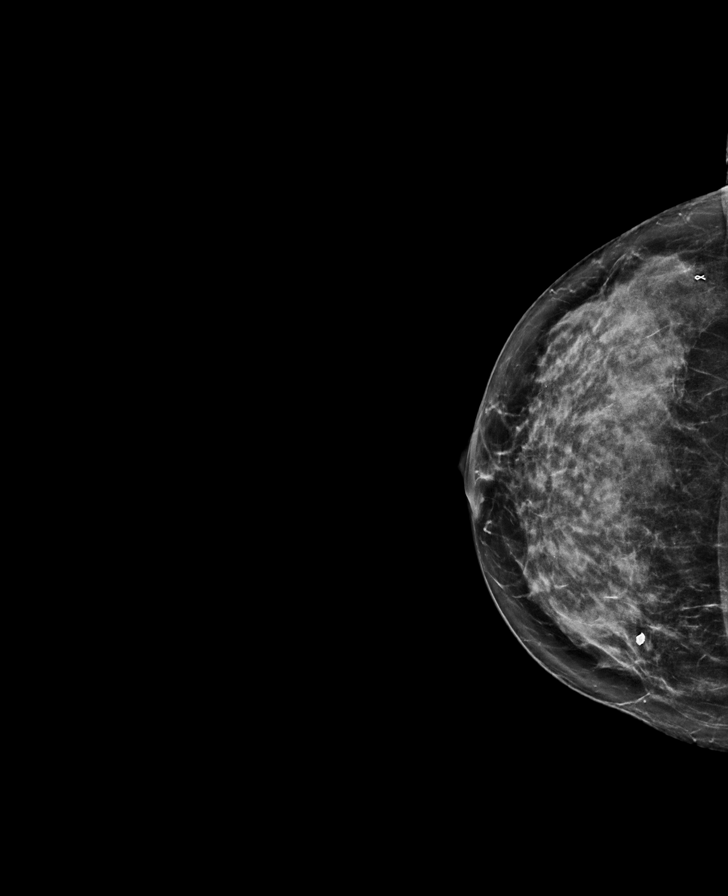

[R CC tomo · tomo slice 30/59.0]
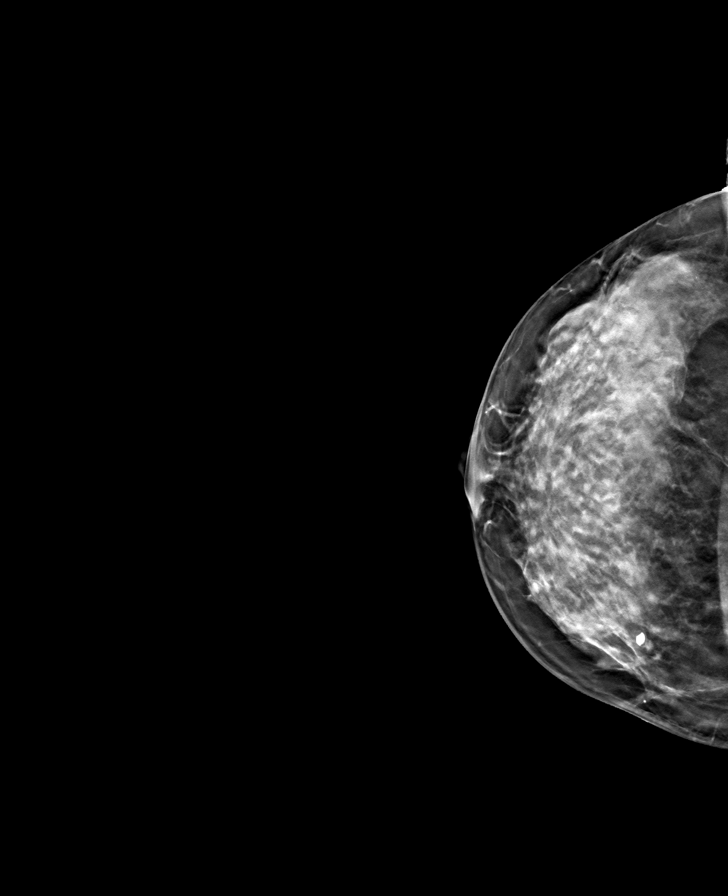

[R MLO tomo · tomo slice 32/63.0]
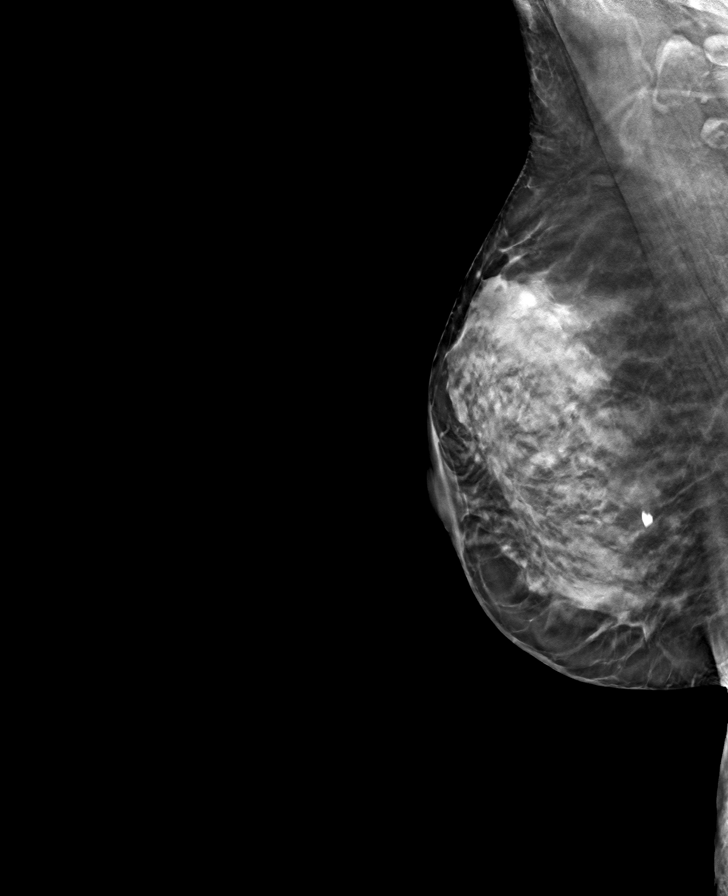

[L CC tomo · tomo slice 31/62.0]
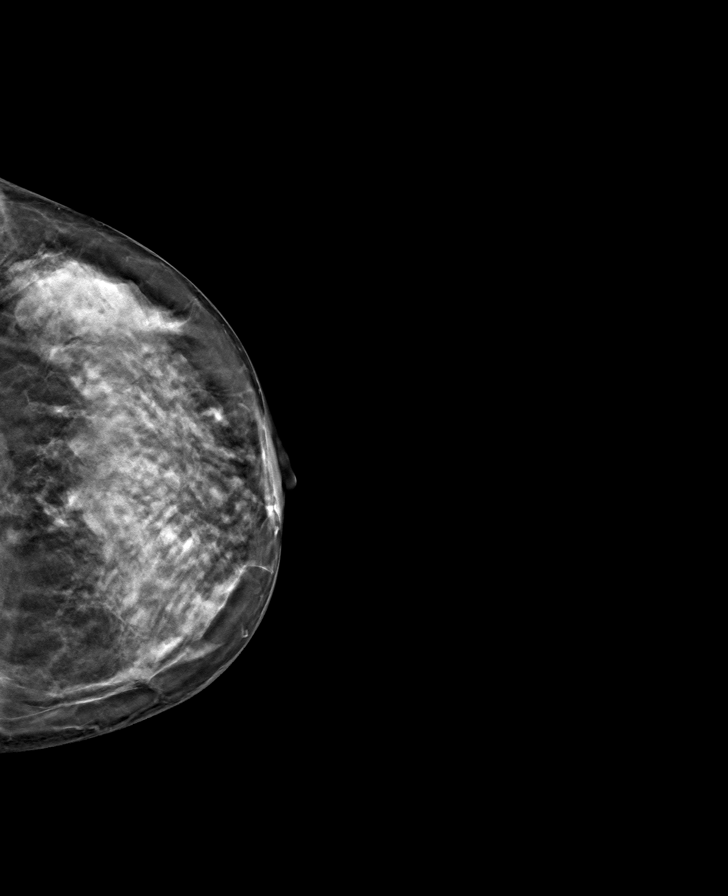

[L MLO tomo · tomo slice 33/65.0]
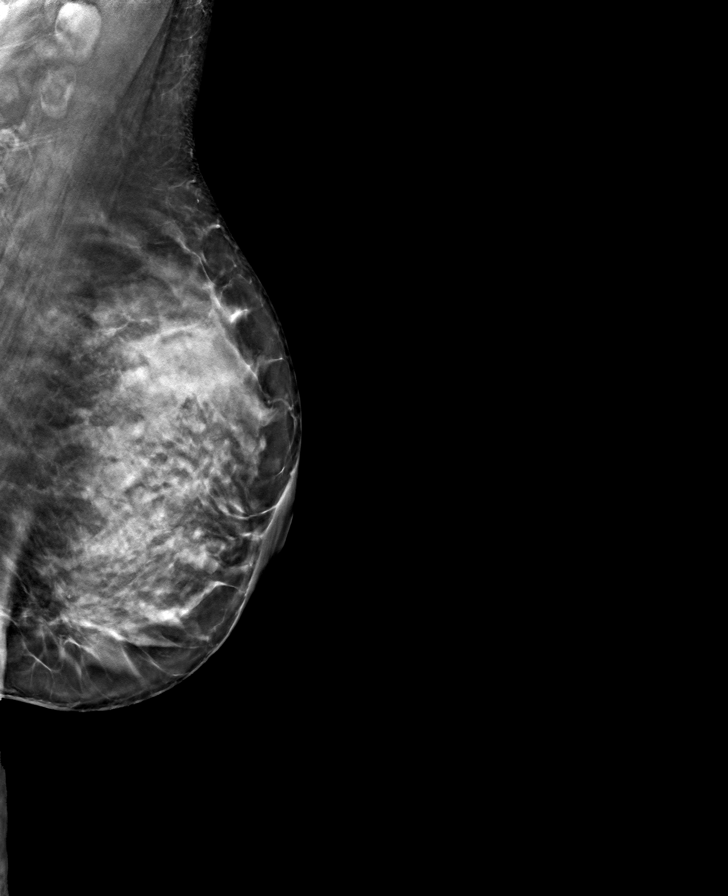

[8 of 24 positions shown; findings below may reference images not displayed]

ACR Breast Density Category c: The breast tissue is heterogeneously
dense, which may obscure small masses.
FINDINGS: There are no findings suspicious for malignancy. Images were
processed with CAD.
IMPRESSION: No mammographic evidence of malignancy. A result letter of this
screening mammogram will be mailed directly to the patient.

RECOMMENDATION:
Screening mammogram in one year. (Code:[id number]-U-LHB)

BI-RADS CATEGORY  1: Negative.

## 2021-05-13 DIAGNOSIS — D485 Neoplasm of uncertain behavior of skin: Secondary | ICD-10-CM | POA: Diagnosis not present

## 2021-05-13 DIAGNOSIS — L821 Other seborrheic keratosis: Secondary | ICD-10-CM | POA: Diagnosis not present

## 2021-05-13 DIAGNOSIS — L820 Inflamed seborrheic keratosis: Secondary | ICD-10-CM | POA: Diagnosis not present

## 2021-05-14 ENCOUNTER — Other Ambulatory Visit: Payer: Self-pay | Admitting: Family Medicine

## 2021-05-14 DIAGNOSIS — Z1231 Encounter for screening mammogram for malignant neoplasm of breast: Secondary | ICD-10-CM

## 2021-05-30 DIAGNOSIS — M2669 Other specified disorders of temporomandibular joint: Secondary | ICD-10-CM | POA: Diagnosis not present

## 2021-07-16 ENCOUNTER — Ambulatory Visit
Admission: RE | Admit: 2021-07-16 | Discharge: 2021-07-16 | Disposition: A | Discharge status: Home / Self Care | Payer: BC Managed Care – PPO | Source: Ambulatory Visit | Attending: Family Medicine | Admitting: Family Medicine

## 2021-07-16 ENCOUNTER — Other Ambulatory Visit: Payer: Self-pay

## 2021-07-16 DIAGNOSIS — Z1231 Encounter for screening mammogram for malignant neoplasm of breast: Secondary | ICD-10-CM

## 2021-08-12 DIAGNOSIS — M5412 Radiculopathy, cervical region: Secondary | ICD-10-CM | POA: Diagnosis not present

## 2021-08-12 DIAGNOSIS — M542 Cervicalgia: Secondary | ICD-10-CM | POA: Diagnosis not present

## 2021-08-12 DIAGNOSIS — M546 Pain in thoracic spine: Secondary | ICD-10-CM | POA: Diagnosis not present

## 2021-08-12 DIAGNOSIS — M549 Dorsalgia, unspecified: Secondary | ICD-10-CM | POA: Diagnosis not present

## 2021-09-03 DIAGNOSIS — M4802 Spinal stenosis, cervical region: Secondary | ICD-10-CM | POA: Diagnosis not present

## 2021-09-03 DIAGNOSIS — M5412 Radiculopathy, cervical region: Secondary | ICD-10-CM | POA: Diagnosis not present

## 2021-09-03 DIAGNOSIS — M25511 Pain in right shoulder: Secondary | ICD-10-CM | POA: Diagnosis not present

## 2021-09-03 DIAGNOSIS — M50123 Cervical disc disorder at C6-C7 level with radiculopathy: Secondary | ICD-10-CM | POA: Diagnosis not present

## 2021-09-03 DIAGNOSIS — M4312 Spondylolisthesis, cervical region: Secondary | ICD-10-CM | POA: Diagnosis not present

## 2021-09-06 DIAGNOSIS — E041 Nontoxic single thyroid nodule: Secondary | ICD-10-CM | POA: Diagnosis not present

## 2021-09-06 DIAGNOSIS — Z01419 Encounter for gynecological examination (general) (routine) without abnormal findings: Secondary | ICD-10-CM | POA: Diagnosis not present

## 2021-09-18 DIAGNOSIS — Z6823 Body mass index (BMI) 23.0-23.9, adult: Secondary | ICD-10-CM | POA: Diagnosis not present

## 2021-09-18 DIAGNOSIS — M47892 Other spondylosis, cervical region: Secondary | ICD-10-CM | POA: Diagnosis not present

## 2021-09-18 DIAGNOSIS — M542 Cervicalgia: Secondary | ICD-10-CM | POA: Diagnosis not present

## 2021-09-18 DIAGNOSIS — I10 Essential (primary) hypertension: Secondary | ICD-10-CM | POA: Diagnosis not present

## 2021-10-01 DIAGNOSIS — Z23 Encounter for immunization: Secondary | ICD-10-CM | POA: Diagnosis not present

## 2021-10-17 DIAGNOSIS — D225 Melanocytic nevi of trunk: Secondary | ICD-10-CM | POA: Diagnosis not present

## 2021-10-17 DIAGNOSIS — T887XXA Unspecified adverse effect of drug or medicament, initial encounter: Secondary | ICD-10-CM | POA: Diagnosis not present

## 2021-10-17 DIAGNOSIS — L814 Other melanin hyperpigmentation: Secondary | ICD-10-CM | POA: Diagnosis not present

## 2021-10-17 DIAGNOSIS — L821 Other seborrheic keratosis: Secondary | ICD-10-CM | POA: Diagnosis not present

## 2021-11-14 DIAGNOSIS — J3089 Other allergic rhinitis: Secondary | ICD-10-CM | POA: Diagnosis not present

## 2021-11-14 DIAGNOSIS — M546 Pain in thoracic spine: Secondary | ICD-10-CM | POA: Diagnosis not present

## 2022-06-05 ENCOUNTER — Other Ambulatory Visit: Payer: Self-pay | Admitting: Family Medicine

## 2022-06-05 DIAGNOSIS — Z1231 Encounter for screening mammogram for malignant neoplasm of breast: Secondary | ICD-10-CM

## 2022-07-24 ENCOUNTER — Ambulatory Visit
Admission: RE | Admit: 2022-07-24 | Discharge: 2022-07-24 | Disposition: A | Discharge status: Home / Self Care | Payer: PPO | Source: Ambulatory Visit | Attending: Family Medicine | Admitting: Family Medicine

## 2022-07-24 DIAGNOSIS — Z1231 Encounter for screening mammogram for malignant neoplasm of breast: Secondary | ICD-10-CM

## 2023-06-17 ENCOUNTER — Other Ambulatory Visit: Payer: Self-pay | Admitting: Family Medicine

## 2023-06-17 DIAGNOSIS — Z1231 Encounter for screening mammogram for malignant neoplasm of breast: Secondary | ICD-10-CM

## 2023-07-30 ENCOUNTER — Ambulatory Visit
Admission: RE | Admit: 2023-07-30 | Discharge: 2023-07-30 | Disposition: A | Discharge status: Home / Self Care | Payer: PPO | Source: Ambulatory Visit | Attending: Family Medicine | Admitting: Family Medicine

## 2023-07-30 DIAGNOSIS — Z1231 Encounter for screening mammogram for malignant neoplasm of breast: Secondary | ICD-10-CM

## 2023-09-30 ENCOUNTER — Ambulatory Visit
Admission: RE | Admit: 2023-09-30 | Discharge: 2023-09-30 | Disposition: A | Discharge status: Home / Self Care | Payer: PPO | Source: Ambulatory Visit | Attending: Family Medicine | Admitting: Family Medicine

## 2024-01-06 NOTE — Progress Notes (Signed)
 Patient is here today for lab work only.  Electronically signed by: Alfonso Ezzard Rocher, CMA 01/06/2024 3:42 PM

## 2024-01-10 NOTE — Progress Notes (Signed)
 HPI   Ruth Long is a 68 year old female with past medical history of GERD, seasonal allergies presents today for a subsequent Medicare annual wellness visit, complete physical exam and follow-up of her hypertension, diabetes, lipids.  Last pap/pelvic: 3/15/2 2010 Health maintenance reviewed and updated.  Diabetes Mellitus Type II, Follow-up: Patient here for follow-up of Type 2 diabetes mellitus.  Current symptoms/problems include none and have been stable. Symptoms have been present for several months.  Known diabetic complications: none Cardiovascular risk factors: advanced age (older than 5 for men, 80 for women), diabetes mellitus, dyslipidemia, and hypertension Current diabetic medications include oral agent (monotherapy): glipiZIDE.   Eye exam current (within one year): no Weight trend: stable Prior visit with dietician: no Current diet: well balanced Current exercise: gardening, housecleaning, walking, and yard work  Current monitoring regimen: office lab tests - 2 times yearly Any episodes of hypoglycemia? no  Is she on ACE inhibitor or angiotensin II receptor blocker?  Yes   Dyslipidemia: Patient presents for evaluation of lipids.  Compliance with treatment thus far has been good.  A repeat fasting lipid profile was done.  The patient does not use medications that may worsen dyslipidemias (corticosteroids, progestins, anabolic steroids, diuretics, beta-blockers, amiodarone, cyclosporine, olanzapine). The patient exercises every other day.  The patient is not known to have coexisting coronary artery disease.   Cardiac Risk Factors Age > 45-female, > 55-female:  YES  +1  Smoking:   NO  Sig. family hx of CHD*:  NO  Hypertension:   YES  +1  Diabetes:   YES  +1  HDL < 35:   NO  HDL > 59:   NO  Total: 3  *- Sig. family h/o CHD per NCEP = MI or sudden death at <55yo in  father or other 1st-degree female relative, or <65yo in mother or  other 1st-degree female relative    Hypertension: Patient here for follow-up of elevated blood pressure. She is exercising and is adherent to low salt diet.  Blood pressure is well controlled at home. Cardiac symptoms none. Patient denies chest pain, exertional chest pressure/discomfort, fatigue, and irregular heart beat.  Cardiovascular risk factors: advanced age (older than 62 for men, 50 for women), diabetes mellitus, dyslipidemia, and hypertension. Use of agents associated with hypertension: none. History of target organ damage: none.   Joint/Muscle Pain: Patient complains of arthralgias for which has been present for a few months. Pain is located in bilateral knees and hips, is described as aching and sharp, and is intermittent .  Associated symptoms include: decreased range of motion.  The patient has tried NSAIDs for pain, with minimal relief.    Review of Systems   Review of Systems A complete ROS was performed with positive/negative pertinent findings listed in the HPI. All other systems are negative.   Constitutional symptoms: negative Eyes:  negative Ear, nose, throat:  negative Cardiovascular:  negative Respiratory:  negative Gastrointestinal:  negative Genitourinary:  negative Skin:  negative Neurological:  negative Musculoskeletal:  joint pain or swelling Psychiatric:  negative Endocrine:  negative Hematological:  negative Allergic:  negative   Medical History   Past Medical History:  Diagnosis Date  . History of nonmelanoma skin cancer   . Hypertension   . Varicose veins of left lower extremity    Past Surgical History:  Procedure Laterality Date  . APPENDECTOMY     Procedure: APPENDECTOMY  . BREAST BIOPSY     Procedure: BREAST BIOPSY  . BUNIONECTOMY  Procedure: BUNIONECTOMY  . CHOLECYSTECTOMY     Procedure: CHOLECYSTECTOMY  . HYSTERECTOMY      Procedure: HYSTERECTOMY  . KNEE ARTHROSCOPY     Procedure: KNEE ARTHROSCOPY  . ROTATOR CUFF REPAIR     Procedure: ROTATOR CUFF REPAIR  .  THYROIDECTOMY, PARTIAL     Procedure: THYROIDECTOMY, PARTIAL  . TONSILLECTOMY     Procedure: TONSILLECTOMY   Family History  Problem Relation Name Age of Onset  . Cancer Mother         Throat  . Cancer Father         Brain  . Hypertension Father    . Heart attack Brother    . Breast cancer Maternal Aunt  45       recurrence in her 23s  . Diabetes Maternal Grandmother    . Diabetes Maternal Grandfather    . Diabetes Paternal Grandmother    . Diabetes Paternal Grandfather    . Arthritis Neg Hx    . Gout Neg Hx    . Heart disease Neg Hx    . Thyroid  disease Neg Hx    . Ankylosing spondylitis Neg Hx    . Psoriasis Neg Hx    . Lupus Neg Hx    . Rheum arthritis Neg Hx     Social History   Social History Narrative  . Not on file   Allergies  Allergen Reactions  . Iodine Swelling  . Shellfish Containing Products Swelling  . Lidocaine  Rash  . Penicillin Swelling  . Sodium Tetradecyl Sulfate Rash    Immunization History  Administered Date(s) Administered  . Hep B, Unspecified 12/14/1998, 01/25/1999, 06/04/1999  . Influenza, High-dose Seasonal, Quadrivalent, Preservative Free 10/02/2020, 10/01/2021, 10/10/2022  . Influenza, Injectable, Quadrivalent, Preservative Free 09/07/2017, 09/08/2018, 09/20/2019  . Influenza, Unspecified 09/20/2014, 10/27/2017  . Influenza, high-dose, trivalent, PF 10/21/2023  . MMR 04/21/2012, 05/17/2018  . Moderna SARS-CoV-2 Primary Series 12+ yrs 02/09/2020, 03/08/2020, 11/15/2020  . Pfizer SARS-CoV-2 Bivalent 12+ yrs 11/05/2021  . Tdap 06/18/2010, 09/07/2017  . Zoster, Recombinant 02/10/2018, 04/23/2018     Physical Exam   Vitals:   01/11/24 0836  BP: 122/80  Pulse: 65  SpO2: 98%   General Appearance:    Alert, cooperative, no distress, appears stated age  Head:    Normocephalic, without obvious abnormality, atraumatic  Eyes:    PERRL, conjunctiva clear, EOM's intact,   Ears:    Normal TM's and external ear canals, both ears  Nose:    Nares normal, septum midline, mucosa normal, no  drainage or sinus tenderness  Throat:   Lips, mucosa, and tongue normal; teeth and gums   normal  Neck:   Supple, symmetrical, trachea midline, no adenopathy;       thyroid :  no enlargement/tenderness/nodules; no carotid   bruit or JVD  Back:     Symmetric, no curvature, ROM normal  Lungs:     Clear to auscultation bilaterally, no wheezes or crackles,   respirations unlabored  Chest wall: Breasts:    No tenderness or deformity   Not examined  Heart:    Regular rate and rhythm, S1 and S2 normal, no murmur, rub or gallop  Abdomen:     Soft, non-tender, bowel sounds active all four quadrants,    no masses, no organomegaly  Extremities:   Extremities normal, atraumatic, no cyanosis or edema  Pulses:   2+ and symmetric all extremities  Skin:   Skin color, texture, turgor normal, no rashes or lesions  Lymph nodes:  Cervical, supraclavicular, and axillary nodes normal  Neurologic:    CNII-XII intact. Normal strength, sensation and reflexes      throughout                                   Feet: No ulcerations, calluses, sores, sensation intact.  Pulses normal.  Assessment/Plan   1. Encounter for subsequent annual wellness visit (AWV) in Medicare patient (Primary) Age-appropriate wellness information was given.  2. Mixed dyslipidemia Continue strict ADA low-fat low-cholesterol diet and current medications.  Recent lipid profile was okay.  3. Essential hypertension Blood pressure at goal on current medications.  4. Type 2 diabetes mellitus with hyperglycemia, without long-term current use of insulin  (HCC) Continue strict ADA low-fat low-cholesterol diet and current medications.  Recent hemoglobin A1c was 7.0.  5. Elevated serum free T4 level  - T4, Free; Future - TSH; Future - T4, Free - TSH  6. Chronic pain of both knees  - XR Knee 4+ Views Left -arthritic changes - XR Knee 4+ Views Right-moderate arthritic changes - celecoxib  (CeleBREX) 200 mg capsule; Take 1 capsule (200 mg total) by mouth 2 (two) times a day.  Dispense: 60 capsule; Refill: 0  7. Bilateral hip pain  - XR Hips Bilateral 3 Or 4 Vw With Or Without Pelvis-mild arthritic changes - celecoxib (CeleBREX) 200 mg capsule; Take 1 capsule (200 mg total) by mouth 2 (two) times a day.  Dispense: 60 capsule; Refill: 0    No orders of the defined types were placed in this encounter.   Mammogram is up-to-date  Healthy Lifestyle: Reviewed and provided information about 4 principles of healthy living (Do not smoke; BMI<30; 150 minutes of exercise per week; at least 5 servings of fruits or vegetables daily).  Portion control emphasized.  Recommend pedometer with goal of 10,000 steps per day.   Age appropriate wellness instructions reviewed and provided in AVS.  Risks, benefits, and alternatives of the medications and treatment plan prescribed today were discussed, and patient expressed understanding. Plan follow-up as discussed or as needed if any worsening symptoms or change in condition.  Medication list given to patient.  No barriers to learning identified.    Electronically signed by: Darryle Sherwin Chester, CMA 01/11/2024 9:00 AM   This document serves as a record of services personally performed by Dr. Lamar Simpers.  It was created on their behalf by Darryle Chester, a trained medical scribe, and Certified Medical Assistant (CMA). During the course of documenting the history, physical exam and medical decision making, I was functioning as a stage manager. The creation of this record is the provider's dictation and/or activities during the visit.  I agree the documentation is accurate and complete.  Electronically signed by: Lamar DELENA Simpers, MD 01/11/2024 10:04 AM    The 4 Principles of Healthy Living Do Not Smoke.  Maintain a BMI<30.  Exercise 150 minutes/week. (30 minutes 5 days a week of some form of aerobic exercise)  Eat 5 servings of fruits or  vegetables daily.   Several studies have conclusively shown that individuals who do these things have a dramatic reduction in overall mortality, heart disease, diabetes, hypertension, stroke, congestive heart failure, and cancer.  This means that without taking any pills, vitamins, tonics, etc - without spending a single penny - you can live a longer, healthier, more productive life.  Let's look at these 4 principles separately.   1. Do Not  Smoke - Make up your mind to quit, talk with your doctor to formulate a plan, and set a date.  2. Get to and maintain a BMI<30.  This gets you out of the obese category.  No longer being obese will dramatically reduce you and your family's risk of a multitude of health problems.  It is not easy, but, it is possible.  If you are extremely obese it may take years, but, each step you take will lead to dramatic rewards.  With just 20 pounds of weight loss most people feel better, have less fatigue and joint pain, and feel more energetic.  If you have health problems like diabetes, hypertension, or high cholesterol you might do away with your need for some medications.  And most of all, while you change you lifestyle to attain this goal you will set a good example for all those around you, especially your children.  Here are two proven steps to help you start losing weight. Portion Control - Eating your meals on a smaller plate and limiting the amount of calories you eat at each meal has been shown to lead to weight loss. The average plate is 10 inches in diameter.  A 10 inch plate piled high with food can add up to 1500 calories (even more if you go back for seconds).  The typical man needs 2000 calories per day total.  Try using a smaller plate (8 inch paper plate or 7 inch saucer) at each meal and NEVER go back for second helpings. Pedometer - individuals who wear a pedometer and try to walk 10,000 steps each day increase their physical activity, lose weight, and decrease  their blood pressures.  3. Exercise 150 minutes/week.  The overall health benefits of regular aerobic exercise are overwhelming:  Reduces the risk of dying prematurely. Reduces the risk of dying from heart disease.  Reduces the risk of stroke.  Reduces the risk of developing diabetes.  Reduces the risk of developing high blood pressure.  Helps reduce blood pressure in people who already have high blood pressure.  Reduces the risk of developing colon cancer.  Reduces feelings of depression and anxiety.  Helps control weight.  Helps build and maintain healthy bones, muscles and joints.  Helps older adults become stronger and better able to move about without falling.  Promotes psychological well-being.    If you have health problems or are over age 27 we recommend consulting your doctor before beginning.  We recommend starting slow and working up.  Start by reading the Healthnote on Starting an Exercise Program then get going.  Do not over think the process.  Pick something simple at home like walking with friends, using a treadmill, or riding an exercise bike.  Experiment with different types of exercise until you find something you can tolerate (it does not have to be fun).  Pick a 30 minute disc of inspirational music and listen to it while you exercise.  The more you do it the easier it will become and the better you will feel.  4. Eat 5 servings of fruits or vegetables daily.  A serving size is: One medium-size fruit  1/2 cup raw, cooked, frozen or canned fruits (in 100% juice) or vegetables  3/4 cup (6 oz.) 100% fruit or vegetable juice  1 cup raw, leafy vegetables  1/4 cup dried fruit   While this sounds easy enough actually getting this much fruits and vegetables takes some work and planning.  You will need  to experiment with different types of fruits and vegetables to find ones you and your family can eat every day.  Some simple tips include:   - Add fruit to your cereal each  morning.  - Eat a salad each day for lunch.  The typical bowl of salad counts for 2 servings of vegetables.    - Have some fruits and vegetables at every meal.  Use canned or frozen products if needed.  - Eat fruits and vegetables for snacks - especially for the kids.  - Replace the side of fries or chips with a cup of fruit, an apple, or a bowl of celery.  What are the benefits? Reduces heart disease and stroke. Possible reduction in cancer risk. Protects against the development of diabetes. Filling up on fat free fruits and vegetable decreases the amount of high fat foods you will eat, aiding in weight loss.  You can find more information on healthy living at www.Http://sutton.net/.   [facility]  - FAMILY MEDICINE [place] Medicare Wellness Visit Type:: Subsequent Annual Wellness Visit  Name: Ruth Long Date of Birth: [birth year] Age: 68 y.o. MRN: [id number] Visit Date: 01/11/2024  History obtained from: patient  Living Arrangements/Support System/Health Assessment/Pain/Stress Marital status: married Number of children: 1 Occupation: retired Research Officer, [employer] arrangements: lives with spouse/significant other Does the patient have a support system (family, friend, church, conservation officer, nature, etc)?: Yes   Do you have any dental concerns?: No In the past month, have you experienced a change in your bladder control?: No   Do you have any difficulty obtaining your medications?: No   Do you have trouble consistently taking or remembering to take all of your medications as prescribed?: No Patient rates overall stress level as: Some Does stress affect daily life?: No Typical amount of pain: none Does pain affect daily life?: No Are you currently prescribed opioids?: No                Depression Screening      Social History (Tobacco/Drugs/Sexual Activity) Yamilette reports that she has never smoked. She has never used smokeless  tobacco. Tobacco Use?: No How many times in the past year have you used a recreational drug or used a prescription medication for nonmedical reasons?: None Risk factors for sexually transmitted infections (i.e., multiple sexual partners): No Are you bothered by sexual problems?: No  Alcohol Screening How often do you have a drink containing alcohol?: Never How many standard drinks containing alcohol do you have on a typical day?: Never, 1 or 2 drinks How often do you have six or more drinks on one occasion?: Never Audit-C Score: 0  Physical Activity Regular exercise?: Yes Exercise frequency (times per week): 5 Exercise intensity: moderate (like brisk walking)  Diet How many meals a day?: 2 Eats fruit and vegetables daily?: Yes Most meals are obtained by: preparing their own meals  Home and Transportation Safety All rugs have non-skid backing?: (!) No All stairs or steps have railings?: Yes Grab bars in the bathtub or shower?: (!) No Have non-skid surface in bathtub or shower?: (!) No Good home lighting?: Yes Regular seat belt use?: Yes      Activities of Daily Living Feed self?: Yes Bathe self?: Yes Dress self?: Yes Use toilet without assistance?: Yes Walk without assistance?: Yes    Instrumental Activities of Daily Living Manage finances?: Yes Shop for themselves?: Yes Prepare meals?: Yes Use the telephone?: Yes Manage medications?: Yes   Performs basic housework/laundry?: Yes Drives?:  Yes Primary transportation is: driving  Hearing Concerns about hearing?: No Uses hearing aids?: No Hear whispered voice? (Observed): Yes  Vision Concerns about vision?: No    Fall Risk Is the patient ambulatory?: Yes One or more falls in the last year:: Yes Feels unsteady when walking:: No  Cognitive Assessment Has a diagnosis of dementia or cognitive impairment?: No Are there any memory concerns by the patient, others, or providers?: No              Advance  Directives Living will?: Yes Advance directive information provided to patient: No Healthcare POA?: Yes Name of Health Care Agent: JENEAN ESCANDON Relationship to patient: Spouse Health Care Agent's phone number: [phone]         Other History I reviewed and updated the following risk factors and conditions as appropriate: Reviewed/Updated: Problem List, Medical History, Surgical History, Family History, Medications, Allergies Reviewed/Updated: Vital Signs (height, weight, and BP), Immunizations, Health Maintenance Patient Care Team Updated: Done  Vital Signs BP 122/80 (BP Location: Left arm, Patient Position: Sitting)   Pulse 65   Ht 1.6 m (5' 3)   Wt 61.2 kg (135 lb)   SpO2 98%   BMI 23.91 kg/m   Screening and Immunizations Health Maintenance Status       Date Due Completion Dates   Bone Density Scan Never done ---   Diabetes: Retinopathy Screening Combo 10/29/2021 10/29/2020 (Done)   Comment on 10/29/2020: See [facility]   Diabetes:  Quantitative uACR for Kidney Evaluation 01/09/2024 01/08/2023, 01/02/2022   Diabetes: Foot Exam 01/09/2024 01/08/2023 (Done)   Comment on 01/08/2023: See [facility]   Adult RSV (60+ Years or Pregnancy) (1 - Risk 60-74 years 1-dose series) 12/02/2024 (Originally 12/29/2014) ---   Pneumococcal Vaccine for Ages 65+ (1 of 2 - PCV) 12/02/2024 (Originally 12/29/1960) ---   Hepatitis C Screening 12/02/2024 (Originally 12/29/1972) ---   COVID-19 Vaccine (5 - 2024-25 season) 12/02/2024 (Originally 08/30/2023) 11/05/2021, 11/15/2020   Depression Screening 10/12/2024 10/13/2023   Diabetes:  eGFR for Kidney Evaluation 01/05/2025 01/06/2024, 12/03/2023   Diabetes: Hemoglobin A1C 01/05/2025 01/06/2024, 01/06/2024   Comprehensive Annual Visit 01/10/2025 01/11/2024, 01/08/2023   Medicare Annual Wellness (AWV) Subsequent Visits 01/10/2025 01/11/2024, 01/06/2022   Breast Cancer Screening (Mammogram) 10/01/2025 10/02/2023, 09/30/2023   Comment on 07/10/2021: See [facility]    Colorectal Cancer Screening 12/23/2025 12/24/2015, 12/24/2015   Comment on 12/04/2014: See [facility]   DTaP/Tdap/Td Vaccines (3 - Td or Tdap) 09/08/2027 09/07/2017, 06/18/2010       Immunization History  Administered Date(s) Administered  . Hep B, Unspecified 12/14/1998, 01/25/1999, 06/04/1999  . Influenza, High-dose Seasonal, Quadrivalent, Preservative Free 10/02/2020, 10/01/2021, 10/10/2022  . Influenza, Injectable, Quadrivalent, Preservative Free 09/07/2017, 09/08/2018, 09/20/2019  . Influenza, Unspecified 09/20/2014, 10/27/2017  . Influenza, high-dose, trivalent, PF 10/21/2023  . MMR 04/21/2012, 05/17/2018  . Moderna SARS-CoV-2 Primary Series 12+ yrs 02/09/2020, 03/08/2020, 11/15/2020  . Pfizer SARS-CoV-2 Bivalent 12+ yrs 11/05/2021  . Tdap 06/18/2010, 09/07/2017  . Zoster, Recombinant 02/10/2018, 04/23/2018    Assessment/Plan: Subsequent Annual Wellness Visit: The topics above were reviewed with the patient.  Healthy lifestyle principles reviewed.  Recommendations provided when indicated.  Follow up 1 year for next wellness visit.  No orders of the defined types were placed in this encounter.   No orders of the defined types were placed in this encounter.   Patient Care Team: Lamar DELENA Simpers, MD as PCP - General (Family Medicine) Lamar DELENA Simpers, MD as PCP - Attributed  Electronically  signed by: Darryle Sherwin Chester, CMA 01/11/2024 9:00 AM  I agree the documentation is accurate and complete.  Electronically signed by: Lamar DELENA Simpers, MD 01/11/2024 10:05 AM

## 2024-08-22 ENCOUNTER — Ambulatory Visit: Payer: Self-pay | Admitting: Orthopedic Surgery

## 2024-09-05 ENCOUNTER — Other Ambulatory Visit: Payer: Self-pay | Admitting: Family Medicine

## 2024-09-05 DIAGNOSIS — Z1231 Encounter for screening mammogram for malignant neoplasm of breast: Secondary | ICD-10-CM

## 2024-09-12 ENCOUNTER — Ambulatory Visit: Payer: Self-pay | Admitting: Orthopedic Surgery

## 2024-09-12 NOTE — Patient Instructions (Addendum)
 SURGICAL WAITING ROOM VISITATION  Patients having surgery or a procedure may have no more than 2 support people in the waiting area - these visitors may rotate.    Children under the age of 22 must have an adult with them who is not the patient.  Visitors with respiratory illnesses are discouraged from visiting and should remain at home.  If the patient needs to stay at the hospital during part of their recovery, the visitor guidelines for inpatient rooms apply. Pre-op nurse will coordinate an appropriate time for 1 support person to accompany patient in pre-op.  This support person may not rotate.    Please refer to the [facility] website for the visitor guidelines for Inpatients (after your surgery is over and you are in a regular room).       Your procedure is scheduled on:  09/23/24    Report to [facility] Main Entrance    Report to admitting at   10:00 AM   Call this number if you have problems the morning of surgery [phone]   Do not eat food :After Midnight.   After Midnight you may have the following liquids until  9:30 AM DAY OF SURGERY  Water Non-Citrus Juices (without pulp, NO RED-Apple, White grape, White cranberry) Black Coffee (NO MILK/CREAM OR CREAMERS, sugar ok)  Clear Tea (NO MILK/CREAM OR CREAMERS, sugar ok) regular and decaf                             Plain Jell-O (NO RED)                                           Fruit ices (not with fruit pulp, NO RED)                                     Popsicles (NO RED)                                                               Sports drinks like Gatorade (NO RED)             .        The day of surgery:  Drink ONE (1) Pre-Surgery  G2 at 9:30 AM the morning of surgery. Drink in one sitting. Do not sip.  This drink was given to you during your hospital  pre-op appointment visit. Nothing else to drink after completing the  Pre-SurgeryG2.           Oral Hygiene is also important to reduce your risk  of infection.                                    Remember - BRUSH YOUR TEETH THE MORNING OF SURGERY WITH YOUR REGULAR TOOTHPASTE  DENTURES WILL BE REMOVED PRIOR TO SURGERY PLEASE DO NOT APPLY Poly grip OR ADHESIVES!!!   Stop all vitamins and herbal supplements 7 days before surgery.   Take these medicines the  morning of surgery with A SIP OF WATER:  Amlodipine, protonix ,prevastatin, montelukast(singulair), Famotidine(pepcid), fenofibrate, gabapentin        DO NOT TAKE ANY ORAL DIABETIC MEDICATIONS DAY OF YOUR SURGERY Do not take Glipizide(glucotrol) the morning of surgery             You may not have any metal on your body including hair pins, jewelry, and body piercing             Do not wear make-up, lotions, powders, perfumes/cologne, or deodorant  Do not wear nail polish including gel and S&S, artificial/acrylic nails, or any other type of covering on natural nails including finger and toenails. If you have artificial nails, gel coating, etc. that needs to be removed by a nail salon please have this removed prior to surgery or surgery may need to be canceled/ delayed if the surgeon/ anesthesia feels like they are unable to be safely monitored.   Do not shave  48 hours prior to surgery.            Do not bring valuables to the hospital. [facility] IS NOT             RESPONSIBLE   FOR VALUABLES.   Contacts, glasses, dentures or bridgework may not be worn into surgery.   Bring small overnight bag day of surgery.   DO NOT BRING YOUR HOME MEDICATIONS TO THE HOSPITAL. PHARMACY WILL DISPENSE MEDICATIONS LISTED ON YOUR MEDICATION LIST TO YOU DURING YOUR ADMISSION IN THE HOSPITAL!    Patients discharged on the day of surgery will not be allowed to drive home.  Someone NEEDS to stay with you for the first 24 hours after anesthesia.   Special Instructions: Bring a copy of your healthcare power of attorney and living will documents the day of surgery if you haven't scanned them  before.              Please read over the following fact sheets you were given: IF YOU HAVE QUESTIONS ABOUT YOUR PRE-OP INSTRUCTIONS PLEASE CALL [phone] Ruth Long   If you received a COVID test during your pre-op visit  it is requested that you wear a mask when out in public, stay away from anyone that may not be feeling well and notify your surgeon if you develop symptoms. If you test positive for Covid or have been in contact with anyone that has tested positive in the last 10 days please notify you surgeon.      Pre-operative 5 CHG Bath Instructions   You can play a key role in reducing the risk of infection after surgery. Your skin needs to be as free of germs as possible. You can reduce the number of germs on your skin by washing with CHG (chlorhexidine  gluconate) soap before surgery. CHG is an antiseptic soap that kills germs and continues to kill germs even after washing.   DO NOT use if you have an allergy to chlorhexidine /CHG or antibacterial soaps. If your skin becomes reddened or irritated, stop using the CHG and notify one of our RNs at [phone].   Please shower with the CHG soap starting 4 days before surgery using the following schedule:     Please keep in mind the following:  DO NOT shave, including legs and underarms, starting the day of your first shower.   You may shave your face at any point before/day of surgery.  Place clean sheets on your bed the day you start using CHG soap. Use  a clean washcloth (not used since being washed) for each shower. DO NOT sleep with pets once you start using the CHG.   CHG Shower Instructions:  If you choose to wash your hair and private area, wash first with your normal shampoo/soap.  After you use shampoo/soap, rinse your hair and body thoroughly to remove shampoo/soap residue.  Turn the water OFF and apply about 3 tablespoons (45 ml) of CHG soap to a CLEAN washcloth.  Apply CHG soap ONLY FROM YOUR NECK DOWN TO YOUR TOES (washing  for 3-5 minutes)  DO NOT use CHG soap on face, private areas, open wounds, or sores.  Pay special attention to the area where your surgery is being performed.  If you are having back surgery, having someone wash your back for you may be helpful. Wait 2 minutes after CHG soap is applied, then you may rinse off the CHG soap.  Pat dry with a clean towel  Put on clean clothes/pajamas   If you choose to wear lotion, please use ONLY the CHG-compatible lotions on the back of this paper.     Additional instructions for the day of surgery: DO NOT APPLY any lotions, deodorants, cologne, or perfumes.   Put on clean/comfortable clothes.  Brush your teeth.  Ask your nurse before applying any prescription medications to the skin.      CHG Compatible Lotions   Aveeno Moisturizing lotion  Cetaphil Moisturizing Cream  Cetaphil Moisturizing Lotion  Clairol Herbal Essence Moisturizing Lotion, Dry Skin  Clairol Herbal Essence Moisturizing Lotion, Extra Dry Skin  Clairol Herbal Essence Moisturizing Lotion, Normal Skin  Curel Age Defying Therapeutic Moisturizing Lotion with Alpha Hydroxy  Curel Extreme Care Body Lotion  Curel Soothing Hands Moisturizing Hand Lotion  Curel Therapeutic Moisturizing Cream, Fragrance-Free  Curel Therapeutic Moisturizing Lotion, Fragrance-Free  Curel Therapeutic Moisturizing Lotion, Original Formula  Eucerin Daily Replenishing Lotion  Eucerin Dry Skin Therapy Plus Alpha Hydroxy Crme  Eucerin Dry Skin Therapy Plus Alpha Hydroxy Lotion  Eucerin Original Crme  Eucerin Original Lotion  Eucerin Plus Crme Eucerin Plus Lotion  Eucerin TriLipid Replenishing Lotion  Keri Anti-Bacterial Hand Lotion  Keri Deep Conditioning Original Lotion Dry Skin Formula Softly Scented  Keri Deep Conditioning Original Lotion, Fragrance Free Sensitive Skin Formula  Keri Lotion Fast Absorbing Fragrance Free Sensitive Skin Formula  Keri Lotion Fast Absorbing Softly Scented Dry Skin Formula   Keri Original Lotion  Keri Skin Renewal Lotion Keri Silky Smooth Lotion  Keri Silky Smooth Sensitive Skin Lotion  Nivea Body Creamy Conditioning Oil  Nivea Body Extra Enriched Lotion  Nivea Body Original Lotion  Nivea Body Sheer Moisturizing Lotion Nivea Crme  Nivea Skin Firming Lotion  NutraDerm 30 Skin Lotion  NutraDerm Skin Lotion  NutraDerm Therapeutic Skin Cream  NutraDerm Therapeutic Skin Lotion  ProShield Protective Hand Cream  Provon moisturizing lotion   How to Manage Your Diabetes Before and After Surgery  Why is it important to control my blood sugar before and after surgery? Improving blood sugar levels before and after surgery helps healing and can limit problems. A way of improving blood sugar control is eating a healthy diet by:  Eating less sugar and carbohydrates  Increasing activity/exercise  Talking with your doctor about reaching your blood sugar goals High blood sugars (greater than 180 mg/dL) can raise your risk of infections and slow your recovery, so you will need to focus on controlling your diabetes during the weeks before surgery. Make sure that the doctor who takes care of your  diabetes knows about your planned surgery including the date and location.  How do I manage my blood sugar before surgery? Check your blood sugar at least 4 times a day, starting 2 days before surgery, to make sure that the level is not too high or low. Check your blood sugar the morning of your surgery when you wake up and every 2 hours until you get to the Short Stay unit. If your blood sugar is less than 70 mg/dL, you will need to treat for low blood sugar: Do not take insulin. Treat a low blood sugar (less than 70 mg/dL) with  cup of clear juice (cranberry or apple), 4 glucose tablets, OR glucose gel. Recheck blood sugar in 15 minutes after treatment (to make sure it is greater than 70 mg/dL). If your blood sugar is not greater than 70 mg/dL on recheck, call [phone] for  further instructions. Report your blood sugar to the short stay nurse when you get to Short Stay.  If you are admitted to the hospital after surgery: Your blood sugar will be checked by the staff and you will probably be given insulin after surgery (instead of oral diabetes medicines) to make sure you have good blood sugar levels. The goal for blood sugar control after surgery is 80-180 mg/dL.   WHAT DO I DO ABOUT MY DIABETES MEDICATION?  Do not take oral diabetes medicines (pills) the morning of surgery.    Patient Signature:  Date:   Nurse Signature:  Date:   Reviewed and Endorsed by [facility] Patient Education Committee, August 2015

## 2024-09-12 NOTE — Progress Notes (Signed)
 Anesthesia Review:  PCP: Cardiologist :  PPM/ ICD: Device Orders: Rep Notified:  Chest x-ray : EKG : Echo : Stress test: Cardiac Cath :   Activity level:  Sleep Study/ CPAP : Fasting Blood Sugar :      / Checks Blood Sugar -- times a day:     DM- type Hgba1c-  08/09/24- 6.7  Glucotrol- none am of surgery   Blood Thinner/ Instructions /Last Dose: ASA / Instructions/ Last Dose :

## 2024-09-12 NOTE — H&P (Signed)
 Ruth Long is an 68 y.o. female.   Chief Complaint: Right knee pain HPI: Patient is here for her H&P. Patient is scheduled for a RTKA by Dr. Duwayne on 09/23/24 at [facility].  Dr. Duwayne and the patient mutually agreed to proceed with a total knee replacement. Risks and benefits of the procedure were discussed including stiffness, suboptimal range of motion, persistent pain, infection requiring removal of prosthesis and reinsertion, need for prophylactic antibiotics in the future, for example, dental procedures, possible need for manipulation, revision in the future and also anesthetic complications including DVT, PE, etc. We discussed the perioperative course, time in the hospital, postoperative recovery and the need for elevation to control swelling. We also discussed the predicted range of motion and the probability that squatting and kneeling would be unobtainable in the future. In addition, postoperative anticoagulation was discussed. We have obtained preoperative medical clearance as necessary. Provided illustrated handout and discussed it in detail. They will enroll in the total joint replacement educational forum at the hospital.  [facility] pre-op scheduled for 9/15. Has walker, cane, wheelchair, ice machine. Has setup for elevating. Her husband, Velinda, has parkinson's.  Past Medical History:  Diagnosis Date   Cancer (HCC)    HX SKIN CANCER   Elevated blood sugar    Esophageal spasm    Headache(784.0)    HX MIGRAINES   History of nonmelanoma skin cancer    Hypertension    PONV (postoperative nausea and vomiting)    RCT (rotator cuff tear)    RIGHT   Varicose veins     Past Surgical History:  Procedure Laterality Date   ABDOMINAL HYSTERECTOMY  2004   APPENDECTOMY  1966   BREAST BIOPSY Right    BUNIONECTOMY  1997   BILATERAL   CHOLECYSTECTOMY  2004   KNEE ARTHROTOMY  1996   LEFT   REDUCTION MAMMAPLASTY     SHOULDER OPEN ROTATOR CUFF REPAIR Right 07/13/2014    Procedure: RIGHT SUBACROMIAL DECOMPRESSION/MINI OPEN ROTATOR CUFF REPAIR ;  Surgeon: [name] Duwayne, MD;  Location: [facility];  Service: Orthopedics;  Laterality: Right;   THYROIDECTOMY, PARTIAL  1994   TONSILLECTOMY  1962    Family History  Problem Relation Age of Onset   Cancer Mother    Varicose Veins Mother    Cancer Father        brain   Social History:  reports that she has never smoked. She has never used smokeless tobacco. She reports that she does not drink alcohol and does not use drugs.  Allergies:  Allergies  Allergen Reactions   Iodine Swelling   Penicillins Swelling   Shellfish Allergy Swelling   Lidocaine Rash   Sotradecol [Sodium Tetradecyl Sulfate] Rash   Current medications: albuterol sulfate HFA 90 mcg/actuation aerosol inhaler amLODIPine 2.5 mg tablet Arthritis Pain (diclofenac) 1 % topical gel doxycycline hyclate 100 mg capsule famotidine 40 mg tablet fenofibrate 160 mg tablet fluticasone propionate 0.05 % topical cream fluticasone propionate 50 mcg/actuation nasal spray,suspension gabapentin 300 mg capsule glipiZIDE 10 mg tablet Hibiclens 4 % topical liquid ibuprofen 800 mg tablet Januvia 100 mg tablet montelukast 10 mg tablet mupirocin 2 % topical ointment pantoprazole 40 mg tablet,delayed release pravastatin 40 mg tablet sulfamethoxazole 800 mg-trimethoprim 160 mg tablet triamcinolone  acetonide 0.1 % dental paste triamcinolone  acetonide 0.1 % topical cream valsartan 320 mg-hydrochlorothiazide 25 mg tablet  Review of Systems  Constitutional: Negative.   HENT: Negative.    Eyes: Negative.   Respiratory: Negative.  Cardiovascular: Negative.   Gastrointestinal: Negative.   Endocrine: Negative.   Genitourinary: Negative.   Musculoskeletal:  Positive for arthralgias, gait problem and joint swelling.  Skin: Negative.   Psychiatric/Behavioral: Negative.      There were no vitals taken for this visit. Physical Exam Constitutional:       Appearance: Normal appearance.  HENT:     Head: Normocephalic and atraumatic.     Right Ear: External ear normal.     Left Ear: External ear normal.     Nose: Nose normal.     Mouth/Throat:     Pharynx: Oropharynx is clear.  Eyes:     Conjunctiva/sclera: Conjunctivae normal.  Cardiovascular:     Rate and Rhythm: Normal rate and regular rhythm.     Pulses: Normal pulses.     Heart sounds: Normal heart sounds.  Pulmonary:     Effort: Pulmonary effort is normal.     Breath sounds: Normal breath sounds.  Abdominal:     General: Bowel sounds are normal.  Musculoskeletal:     Cervical back: Normal range of motion.     Comments: Right knee tender medial joint line. Patellofemoral pain compression. Ranges -5-1 10. Slight varus deformity. No DVT. Ipsilateral ankle exam is unremarkable some discomfort with range of motion of the right hip.  Skin:    General: Skin is warm and dry.  Neurological:     Mental Status: She is alert.    Previous x-rays of the right knee with the PA bent knee weightbearing demonstrates bone-on-bone arthrosis medial compartment of the right knee. Patellofemoral arthrosis is noted with a somewhat thin patella.  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 Duwayne on 09/23/24. We again discussed the procedure itself as well as risks, complications and alternatives, including but not limited to DVT, PE, infx, bleeding, failure of procedure, need for secondary procedure including manipulation, nerve injury, ongoing pain/symptoms, anesthesia risk, even stroke or death. Also discussed typical post-op protocols, activity restrictions, need for PT, flexion/extension exercises, time out of work. Discussed need for DVT ppx post-op per protocol. Discussed dental ppx and infx prevention. Also discussed limitations  post-operatively such as kneeling and squatting. All questions were answered. Patient desires to proceed with surgery as scheduled.  Will hold supplements, ASA and NSAIDs accordingly. Will remain NPO after midnight the night before surgery. Will present to [facility] for pre-op testing. Anticipate hospital stay to include at least 2 midnights given medical history and to ensure proper pain control. Plan ASA for DVT ppx post-op. Plan Tramadol or Dilaudid  with Zofran , Robaxin , Colace, Miralax. Plan home with HHPT post-op with family members at home for assistance. Will follow up 10-14 days post-op for suture removal and xrays.  Plan Right total knee replacement  Sheila Ocasio M Katilyn Miltenberger, PA-C for Dr Duwayne 09/12/2024, 11:07 AM

## 2024-09-12 NOTE — H&P (View-Only) (Signed)
 Ruth Long is an 68 y.o. female.   Chief Complaint: Right knee pain HPI: Patient is here for her H&P. Patient is scheduled for a RTKA by Dr. Duwayne on 09/23/24 at [facility].  Dr. Duwayne and the patient mutually agreed to proceed with a total knee replacement. Risks and benefits of the procedure were discussed including stiffness, suboptimal range of motion, persistent pain, infection requiring removal of prosthesis and reinsertion, need for prophylactic antibiotics in the future, for example, dental procedures, possible need for manipulation, revision in the future and also anesthetic complications including DVT, PE, etc. We discussed the perioperative course, time in the hospital, postoperative recovery and the need for elevation to control swelling. We also discussed the predicted range of motion and the probability that squatting and kneeling would be unobtainable in the future. In addition, postoperative anticoagulation was discussed. We have obtained preoperative medical clearance as necessary. Provided illustrated handout and discussed it in detail. They will enroll in the total joint replacement educational forum at the hospital.  [facility] pre-op scheduled for 9/15. Has walker, cane, wheelchair, ice machine. Has setup for elevating. Her husband, Velinda, has parkinson's.  Past Medical History:  Diagnosis Date   Cancer (HCC)    HX SKIN CANCER   Elevated blood sugar    Esophageal spasm    Headache(784.0)    HX MIGRAINES   History of nonmelanoma skin cancer    Hypertension    PONV (postoperative nausea and vomiting)    RCT (rotator cuff tear)    RIGHT   Varicose veins     Past Surgical History:  Procedure Laterality Date   ABDOMINAL HYSTERECTOMY  2004   APPENDECTOMY  1966   BREAST BIOPSY Right    BUNIONECTOMY  1997   BILATERAL   CHOLECYSTECTOMY  2004   KNEE ARTHROTOMY  1996   LEFT   REDUCTION MAMMAPLASTY     SHOULDER OPEN ROTATOR CUFF REPAIR Right 07/13/2014    Procedure: RIGHT SUBACROMIAL DECOMPRESSION/MINI OPEN ROTATOR CUFF REPAIR ;  Surgeon: [name] Duwayne, MD;  Location: [facility];  Service: Orthopedics;  Laterality: Right;   THYROIDECTOMY, PARTIAL  1994   TONSILLECTOMY  1962    Family History  Problem Relation Age of Onset   Cancer Mother    Varicose Veins Mother    Cancer Father        brain   Social History:  reports that she has never smoked. She has never used smokeless tobacco. She reports that she does not drink alcohol and does not use drugs.  Allergies:  Allergies  Allergen Reactions   Iodine Swelling   Penicillins Swelling   Shellfish Allergy Swelling   Lidocaine Rash   Sotradecol [Sodium Tetradecyl Sulfate] Rash   Current medications: albuterol sulfate HFA 90 mcg/actuation aerosol inhaler amLODIPine 2.5 mg tablet Arthritis Pain (diclofenac) 1 % topical gel doxycycline hyclate 100 mg capsule famotidine 40 mg tablet fenofibrate 160 mg tablet fluticasone propionate 0.05 % topical cream fluticasone propionate 50 mcg/actuation nasal spray,suspension gabapentin 300 mg capsule glipiZIDE 10 mg tablet Hibiclens 4 % topical liquid ibuprofen 800 mg tablet Januvia 100 mg tablet montelukast 10 mg tablet mupirocin 2 % topical ointment pantoprazole 40 mg tablet,delayed release pravastatin 40 mg tablet sulfamethoxazole 800 mg-trimethoprim 160 mg tablet triamcinolone  acetonide 0.1 % dental paste triamcinolone  acetonide 0.1 % topical cream valsartan 320 mg-hydrochlorothiazide 25 mg tablet  Review of Systems  Constitutional: Negative.   HENT: Negative.    Eyes: Negative.   Respiratory: Negative.  Cardiovascular: Negative.   Gastrointestinal: Negative.   Endocrine: Negative.   Genitourinary: Negative.   Musculoskeletal:  Positive for arthralgias, gait problem and joint swelling.  Skin: Negative.   Psychiatric/Behavioral: Negative.      There were no vitals taken for this visit. Physical Exam Constitutional:       Appearance: Normal appearance.  HENT:     Head: Normocephalic and atraumatic.     Right Ear: External ear normal.     Left Ear: External ear normal.     Nose: Nose normal.     Mouth/Throat:     Pharynx: Oropharynx is clear.  Eyes:     Conjunctiva/sclera: Conjunctivae normal.  Cardiovascular:     Rate and Rhythm: Normal rate and regular rhythm.     Pulses: Normal pulses.     Heart sounds: Normal heart sounds.  Pulmonary:     Effort: Pulmonary effort is normal.     Breath sounds: Normal breath sounds.  Abdominal:     General: Bowel sounds are normal.  Musculoskeletal:     Cervical back: Normal range of motion.     Comments: Right knee tender medial joint line. Patellofemoral pain compression. Ranges -5-1 10. Slight varus deformity. No DVT. Ipsilateral ankle exam is unremarkable some discomfort with range of motion of the right hip.  Skin:    General: Skin is warm and dry.  Neurological:     Mental Status: She is alert.    Previous x-rays of the right knee with the PA bent knee weightbearing demonstrates bone-on-bone arthrosis medial compartment of the right knee. Patellofemoral arthrosis is noted with a somewhat thin patella.  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 Duwayne on 09/23/24. We again discussed the procedure itself as well as risks, complications and alternatives, including but not limited to DVT, PE, infx, bleeding, failure of procedure, need for secondary procedure including manipulation, nerve injury, ongoing pain/symptoms, anesthesia risk, even stroke or death. Also discussed typical post-op protocols, activity restrictions, need for PT, flexion/extension exercises, time out of work. Discussed need for DVT ppx post-op per protocol. Discussed dental ppx and infx prevention. Also discussed limitations  post-operatively such as kneeling and squatting. All questions were answered. Patient desires to proceed with surgery as scheduled.  Will hold supplements, ASA and NSAIDs accordingly. Will remain NPO after midnight the night before surgery. Will present to [facility] for pre-op testing. Anticipate hospital stay to include at least 2 midnights given medical history and to ensure proper pain control. Plan ASA for DVT ppx post-op. Plan Tramadol or Dilaudid  with Zofran , Robaxin , Colace, Miralax. Plan home with HHPT post-op with family members at home for assistance. Will follow up 10-14 days post-op for suture removal and xrays.  Plan Right total knee replacement  Sheila Ocasio M Katilyn Miltenberger, PA-C for Dr Duwayne 09/12/2024, 11:07 AM

## 2024-09-12 NOTE — Progress Notes (Signed)
 COVID Vaccine received:  []  No [x]  Yes Date of any COVID positive Test in last 90 days: NO PCP - Lamar Simpers MD Cardiologist - N/A  Chest x-ray -  EKG -   Stress Test -  ECHO -  Cardiac Cath -   Bowel Prep - [x]  No  []   Yes ______  Pacemaker / ICD device [x]  No []  Yes   Spinal Cord Stimulator:[x]  No []  Yes       History of Sleep Apnea? [x]  No []  Yes   CPAP used?- [x]  No []  Yes    Does the patient monitor blood sugar?          [x]  No []  Yes  []  N/A  Patient has: []  NO Hx DM   [x]  Pre-DM                 []  DM1  []   DM2 Does patient have a [employer] or Dexacom? [x]  No []  Yes   Fasting Blood Sugar Ranges-  Checks Blood Sugar ___0__ times a day  GLP1 agonist / usual dose - NO GLP1 instructions:  SGLT-2 inhibitors / usual dose - NO SGLT-2 instructions:   Blood Thinner / Instructions:NO Aspirin Instructions:ASA 325 mg Last dose WAS 08/26/24  Comments:   Activity level: Patient is able  to climb a flight of stairs without difficulty; []  No CP  []  No SOB,   Patient can perform ADLs without assistance.   Anesthesia review: Abnl. EKG,   Patient denies shortness of breath, fever, cough and chest pain at PAT appointment.  Patient verbalized understanding and agreement to the Pre-Surgical Instructions that were given to them at this PAT appointment. Patient was also educated of the need to review these PAT instructions again prior to his/her surgery.I reviewed the appropriate phone numbers to call if they have any and questions or concerns.

## 2024-09-13 ENCOUNTER — Other Ambulatory Visit: Payer: Self-pay

## 2024-09-13 ENCOUNTER — Encounter (HOSPITAL_COMMUNITY): Payer: Self-pay | Admitting: *Deleted

## 2024-09-13 ENCOUNTER — Encounter (HOSPITAL_COMMUNITY)
Admission: RE | Admit: 2024-09-13 | Discharge: 2024-09-13 | Disposition: A | Discharge status: Home / Self Care | Source: Ambulatory Visit | Attending: Specialist | Admitting: Specialist

## 2024-09-13 VITALS — BP 142/79 | HR 71 | Temp 98.4°F | Resp 16 | Ht 62.0 in | Wt 129.0 lb

## 2024-09-13 DIAGNOSIS — I10 Essential (primary) hypertension: Secondary | ICD-10-CM | POA: Insufficient documentation

## 2024-09-13 DIAGNOSIS — Z01818 Encounter for other preprocedural examination: Secondary | ICD-10-CM | POA: Insufficient documentation

## 2024-09-13 DIAGNOSIS — E119 Type 2 diabetes mellitus without complications: Secondary | ICD-10-CM | POA: Diagnosis not present

## 2024-09-13 HISTORY — DX: Hypothyroidism, unspecified: E03.9

## 2024-09-13 HISTORY — DX: Unspecified osteoarthritis, unspecified site: M19.90

## 2024-09-13 LAB — BASIC METABOLIC PANEL WITH GFR
Anion gap: 13 (ref 5–15)
BUN: 23 mg/dL (ref 8–23)
CO2: 28 mmol/L (ref 22–32)
Calcium: 10.1 mg/dL (ref 8.9–10.3)
Chloride: 102 mmol/L (ref 98–111)
Creatinine, Ser: 1.12 mg/dL — ABNORMAL HIGH (ref 0.44–1.00)
GFR, Estimated: 53 mL/min — ABNORMAL LOW (ref >60)
Glucose, Bld: 131 mg/dL — ABNORMAL HIGH (ref 70–99)
Potassium: 3.3 mmol/L — ABNORMAL LOW (ref 3.5–5.1)
Sodium: 144 mmol/L (ref 135–145)

## 2024-09-13 LAB — CBC
HCT: 34.5 % — ABNORMAL LOW (ref 36.0–46.0)
Hemoglobin: 10.3 g/dL — ABNORMAL LOW (ref 12.0–15.0)
MCH: 25.8 pg — ABNORMAL LOW (ref 26.0–34.0)
MCHC: 29.9 g/dL — ABNORMAL LOW (ref 30.0–36.0)
MCV: 86.3 fL (ref 80.0–100.0)
Platelets: 269 K/uL (ref 150–400)
RBC: 4 MIL/uL (ref 3.87–5.11)
RDW: 14.6 % (ref 11.5–15.5)
WBC: 6.5 K/uL (ref 4.0–10.5)
nRBC: 0 % (ref 0.0–0.2)

## 2024-09-13 LAB — SURGICAL PCR SCREEN
MRSA, PCR: NEGATIVE
Staphylococcus aureus: NEGATIVE

## 2024-09-14 NOTE — Telephone Encounter (Signed)
 Resent to [facility]

## 2024-09-14 NOTE — Telephone Encounter (Signed)
 Ruth Long stopped by bc her RX's are being sent to [facility] at [facility].  Can any recent rx's sent there be resent to [facility], on [address].  She can be reached at [phone].

## 2024-09-23 ENCOUNTER — Ambulatory Visit (HOSPITAL_COMMUNITY)

## 2024-09-23 ENCOUNTER — Encounter (HOSPITAL_COMMUNITY)
Admission: RE | Disposition: A | Discharge status: Home / Self Care | Payer: Self-pay | Source: Ambulatory Visit | Attending: Specialist

## 2024-09-23 ENCOUNTER — Ambulatory Visit (HOSPITAL_COMMUNITY): Payer: Self-pay | Admitting: Physician Assistant

## 2024-09-23 ENCOUNTER — Other Ambulatory Visit: Payer: Self-pay

## 2024-09-23 ENCOUNTER — Ambulatory Visit (HOSPITAL_COMMUNITY)
Admission: RE | Admit: 2024-09-23 | Discharge: 2024-09-24 | Disposition: A | Discharge status: Home / Self Care | Source: Ambulatory Visit | Attending: Specialist | Admitting: Specialist

## 2024-09-23 ENCOUNTER — Ambulatory Visit (HOSPITAL_BASED_OUTPATIENT_CLINIC_OR_DEPARTMENT_OTHER): Payer: Self-pay | Admitting: Certified Registered"

## 2024-09-23 ENCOUNTER — Encounter (HOSPITAL_COMMUNITY): Payer: Self-pay | Admitting: Specialist

## 2024-09-23 DIAGNOSIS — I10 Essential (primary) hypertension: Secondary | ICD-10-CM | POA: Insufficient documentation

## 2024-09-23 DIAGNOSIS — Z96651 Presence of right artificial knee joint: Secondary | ICD-10-CM

## 2024-09-23 DIAGNOSIS — M1711 Unilateral primary osteoarthritis, right knee: Secondary | ICD-10-CM | POA: Diagnosis not present

## 2024-09-23 DIAGNOSIS — R739 Hyperglycemia, unspecified: Secondary | ICD-10-CM | POA: Insufficient documentation

## 2024-09-23 DIAGNOSIS — M21161 Varus deformity, not elsewhere classified, right knee: Secondary | ICD-10-CM | POA: Diagnosis not present

## 2024-09-23 DIAGNOSIS — K219 Gastro-esophageal reflux disease without esophagitis: Secondary | ICD-10-CM | POA: Diagnosis not present

## 2024-09-23 HISTORY — PX: TOTAL KNEE ARTHROPLASTY: SHX125

## 2024-09-23 LAB — GLUCOSE, CAPILLARY
Glucose-Capillary: 122 mg/dL — ABNORMAL HIGH (ref 70–99)
Glucose-Capillary: 95 mg/dL (ref 70–99)
Glucose-Capillary: 303 mg/dL — ABNORMAL HIGH (ref 70–99)

## 2024-09-23 SURGERY — ARTHROPLASTY, KNEE, TOTAL
Anesthesia: Monitor Anesthesia Care | Site: Knee | Laterality: Right

## 2024-09-23 MED ORDER — HYDROMORPHONE HCL 2 MG PO TABS
2.0000 mg | ORAL_TABLET | ORAL | Status: DC | PRN
  Administered 2024-09-23 – 2024-09-24 (×4): 2 mg via ORAL
  Filled 2024-09-23 (×4): qty 1

## 2024-09-23 MED ORDER — SODIUM CHLORIDE 0.9% FLUSH
INTRAVENOUS | Status: DC | PRN
  Administered 2024-09-23: 40 mL

## 2024-09-23 MED ORDER — ACETAMINOPHEN 10 MG/ML IV SOLN
1000.0000 mg | INTRAVENOUS | Status: AC
  Administered 2024-09-23: 1000 mg via INTRAVENOUS
  Filled 2024-09-23: qty 100

## 2024-09-23 MED ORDER — PROPOFOL 10 MG/ML IV BOLUS
INTRAVENOUS | Status: DC | PRN
  Administered 2024-09-23 (×2): 10 mg via INTRAVENOUS

## 2024-09-23 MED ORDER — ONDANSETRON HCL 4 MG/2ML IJ SOLN: 4.0000 mg | Freq: Four times a day (QID) | INTRAMUSCULAR | Status: DC | PRN

## 2024-09-23 MED ORDER — CEFAZOLIN SODIUM-DEXTROSE 2-4 GM/100ML-% IV SOLN
2.0000 g | Freq: Four times a day (QID) | INTRAVENOUS | Status: AC
  Administered 2024-09-23 – 2024-09-24 (×2): 2 g via INTRAVENOUS
  Filled 2024-09-23 (×2): qty 100

## 2024-09-23 MED ORDER — METHOCARBAMOL 500 MG PO TABS
500.0000 mg | ORAL_TABLET | Freq: Four times a day (QID) | ORAL | Status: DC | PRN
  Administered 2024-09-23 – 2024-09-24 (×3): 500 mg via ORAL
  Filled 2024-09-23 (×3): qty 1

## 2024-09-23 MED ORDER — DEXAMETHASONE SODIUM PHOSPHATE 10 MG/ML IJ SOLN
INTRAMUSCULAR | Status: DC | PRN
  Administered 2024-09-23: 10 mg

## 2024-09-23 MED ORDER — PHENYLEPHRINE 80 MCG/ML (10ML) SYRINGE FOR IV PUSH (FOR BLOOD PRESSURE SUPPORT)
PREFILLED_SYRINGE | INTRAVENOUS | Status: DC | PRN
  Administered 2024-09-23: 80 ug via INTRAVENOUS
  Administered 2024-09-23: 160 ug via INTRAVENOUS

## 2024-09-23 MED ORDER — PHENOL 1.4 % MT LIQD: 1.0000 | OROMUCOSAL | Status: DC | PRN

## 2024-09-23 MED ORDER — LIDOCAINE HCL (PF) 2 % IJ SOLN
INTRAMUSCULAR | Status: AC
Start: 2024-09-23 — End: 2024-09-23
  Filled 2024-09-23: qty 5

## 2024-09-23 MED ORDER — SODIUM CHLORIDE (PF) 0.9 % IJ SOLN
INTRAMUSCULAR | Status: AC
  Filled 2024-09-23: qty 50

## 2024-09-23 MED ORDER — METOCLOPRAMIDE HCL 5 MG PO TABS: 5.0000 mg | ORAL_TABLET | Freq: Three times a day (TID) | ORAL | Status: DC | PRN

## 2024-09-23 MED ORDER — VALSARTAN-HYDROCHLOROTHIAZIDE 320-25 MG PO TABS: 1.0000 | ORAL_TABLET | Freq: Every morning | ORAL | Status: DC

## 2024-09-23 MED ORDER — FENTANYL CITRATE PF 50 MCG/ML IJ SOSY
50.0000 ug | PREFILLED_SYRINGE | Freq: Once | INTRAMUSCULAR | Status: AC
  Administered 2024-09-23: 50 ug via INTRAVENOUS
  Filled 2024-09-23: qty 2

## 2024-09-23 MED ORDER — FENTANYL CITRATE PF 50 MCG/ML IJ SOSY
25.0000 ug | PREFILLED_SYRINGE | INTRAMUSCULAR | Status: DC | PRN
  Administered 2024-09-23: 25 ug via INTRAVENOUS
  Administered 2024-09-23 (×2): 50 ug via INTRAVENOUS
  Administered 2024-09-23: 25 ug via INTRAVENOUS

## 2024-09-23 MED ORDER — PRAVASTATIN SODIUM 20 MG PO TABS
40.0000 mg | ORAL_TABLET | Freq: Every day | ORAL | Status: DC
  Administered 2024-09-24: 40 mg via ORAL
  Filled 2024-09-23: qty 2

## 2024-09-23 MED ORDER — METOCLOPRAMIDE HCL 5 MG/ML IJ SOLN: 5.0000 mg | Freq: Three times a day (TID) | INTRAMUSCULAR | Status: DC | PRN

## 2024-09-23 MED ORDER — DIPHENHYDRAMINE HCL 25 MG PO CAPS
50.0000 mg | ORAL_CAPSULE | Freq: Every day | ORAL | Status: DC
Start: 2024-09-23 — End: 2024-09-24
  Administered 2024-09-23: 50 mg via ORAL
  Filled 2024-09-23: qty 2

## 2024-09-23 MED ORDER — IRBESARTAN 150 MG PO TABS
300.0000 mg | ORAL_TABLET | Freq: Every day | ORAL | Status: DC
Start: 2024-09-24 — End: 2024-09-24
  Administered 2024-09-24: 300 mg via ORAL
  Filled 2024-09-23: qty 2

## 2024-09-23 MED ORDER — LACTATED RINGERS IV SOLN: INTRAVENOUS | Status: DC

## 2024-09-23 MED ORDER — ALUM & MAG HYDROXIDE-SIMETH 200-200-20 MG/5ML PO SUSP: 30.0000 mL | ORAL | Status: DC | PRN

## 2024-09-23 MED ORDER — PANTOPRAZOLE SODIUM 40 MG PO TBEC
40.0000 mg | DELAYED_RELEASE_TABLET | Freq: Every day | ORAL | Status: DC
  Administered 2024-09-24: 40 mg via ORAL
  Filled 2024-09-23: qty 1

## 2024-09-23 MED ORDER — ONDANSETRON HCL 4 MG/2ML IJ SOLN
INTRAMUSCULAR | Status: DC | PRN
  Administered 2024-09-23: 4 mg via INTRAVENOUS

## 2024-09-23 MED ORDER — PROPOFOL 10 MG/ML IV BOLUS
INTRAVENOUS | Status: AC
  Filled 2024-09-23: qty 20

## 2024-09-23 MED ORDER — ORAL CARE MOUTH RINSE: 15.0000 mL | Freq: Once | OROMUCOSAL | Status: AC

## 2024-09-23 MED ORDER — FENOFIBRATE 160 MG PO TABS
160.0000 mg | ORAL_TABLET | Freq: Every morning | ORAL | Status: DC
  Administered 2024-09-24: 160 mg via ORAL
  Filled 2024-09-23: qty 1

## 2024-09-23 MED ORDER — FENTANYL CITRATE PF 50 MCG/ML IJ SOSY
PREFILLED_SYRINGE | INTRAMUSCULAR | Status: AC
  Filled 2024-09-23: qty 1

## 2024-09-23 MED ORDER — ONDANSETRON HCL 4 MG/2ML IJ SOLN
INTRAMUSCULAR | Status: AC
  Filled 2024-09-23: qty 2

## 2024-09-23 MED ORDER — SODIUM CHLORIDE 0.9 % IR SOLN
Status: DC | PRN
  Administered 2024-09-23: 1000 mL
  Administered 2024-09-23: 3000 mL

## 2024-09-23 MED ORDER — MAGNESIUM CITRATE PO SOLN: 1.0000 | Freq: Once | ORAL | Status: DC | PRN

## 2024-09-23 MED ORDER — 0.9 % SODIUM CHLORIDE (POUR BTL) OPTIME
TOPICAL | Status: DC | PRN
  Administered 2024-09-23: 1000 mL

## 2024-09-23 MED ORDER — DEXMEDETOMIDINE HCL IN NACL 80 MCG/20ML IV SOLN
INTRAVENOUS | Status: DC | PRN
  Administered 2024-09-23: 8 ug via INTRAVENOUS

## 2024-09-23 MED ORDER — MIDAZOLAM HCL 2 MG/2ML IJ SOLN
1.0000 mg | Freq: Once | INTRAMUSCULAR | Status: AC
  Administered 2024-09-23: 1 mg via INTRAVENOUS
  Filled 2024-09-23: qty 2

## 2024-09-23 MED ORDER — FAMOTIDINE 20 MG PO TABS: 40.0000 mg | ORAL_TABLET | Freq: Every day | ORAL | Status: DC

## 2024-09-23 MED ORDER — DEXAMETHASONE SODIUM PHOSPHATE 10 MG/ML IJ SOLN
INTRAMUSCULAR | Status: AC
Start: 2024-09-23 — End: 2024-09-23
  Filled 2024-09-23: qty 1

## 2024-09-23 MED ORDER — BISACODYL 5 MG PO TBEC: 5.0000 mg | DELAYED_RELEASE_TABLET | Freq: Every day | ORAL | Status: DC | PRN

## 2024-09-23 MED ORDER — AMLODIPINE BESYLATE 5 MG PO TABS
2.5000 mg | ORAL_TABLET | Freq: Every day | ORAL | Status: DC
  Administered 2024-09-24: 2.5 mg via ORAL
  Filled 2024-09-23: qty 1

## 2024-09-23 MED ORDER — STERILE WATER FOR IRRIGATION IR SOLN
Status: DC | PRN
  Administered 2024-09-23: 2000 mL

## 2024-09-23 MED ORDER — ACETAMINOPHEN 500 MG PO TABS
1000.0000 mg | ORAL_TABLET | Freq: Four times a day (QID) | ORAL | Status: DC
  Administered 2024-09-23 – 2024-09-24 (×3): 1000 mg via ORAL
  Filled 2024-09-23 (×3): qty 2

## 2024-09-23 MED ORDER — RISAQUAD PO CAPS
1.0000 | ORAL_CAPSULE | Freq: Every day | ORAL | Status: DC
  Administered 2024-09-24: 1 via ORAL
  Filled 2024-09-23: qty 1

## 2024-09-23 MED ORDER — KCL IN DEXTROSE-NACL 20-5-0.9 MEQ/L-%-% IV SOLN
INTRAVENOUS | Status: DC
  Filled 2024-09-23: qty 1000

## 2024-09-23 MED ORDER — HYDROMORPHONE HCL 1 MG/ML IJ SOLN
0.5000 mg | INTRAMUSCULAR | Status: DC | PRN
  Administered 2024-09-24: 1 mg via INTRAVENOUS
  Filled 2024-09-23: qty 1

## 2024-09-23 MED ORDER — FENTANYL CITRATE (PF) 100 MCG/2ML IJ SOLN
INTRAMUSCULAR | Status: AC
  Filled 2024-09-23: qty 2

## 2024-09-23 MED ORDER — BUPIVACAINE-EPINEPHRINE (PF) 0.25% -1:200000 IJ SOLN
INTRAMUSCULAR | Status: AC
  Filled 2024-09-23: qty 30

## 2024-09-23 MED ORDER — PHENYLEPHRINE HCL-NACL 20-0.9 MG/250ML-% IV SOLN
INTRAVENOUS | Status: DC | PRN
  Administered 2024-09-23: 40 ug/min via INTRAVENOUS

## 2024-09-23 MED ORDER — MONTELUKAST SODIUM 10 MG PO TABS: 10.0000 mg | ORAL_TABLET | Freq: Every day | ORAL | Status: DC

## 2024-09-23 MED ORDER — ONDANSETRON HCL 4 MG PO TABS: 4.0000 mg | ORAL_TABLET | Freq: Four times a day (QID) | ORAL | Status: DC | PRN

## 2024-09-23 MED ORDER — CEFAZOLIN SODIUM-DEXTROSE 2-4 GM/100ML-% IV SOLN
2.0000 g | INTRAVENOUS | Status: AC
  Administered 2024-09-23: 2 g via INTRAVENOUS
  Filled 2024-09-23: qty 100

## 2024-09-23 MED ORDER — PROPOFOL 1000 MG/100ML IV EMUL
INTRAVENOUS | Status: AC
  Filled 2024-09-23: qty 100

## 2024-09-23 MED ORDER — FENTANYL CITRATE (PF) 100 MCG/2ML IJ SOLN
INTRAMUSCULAR | Status: DC | PRN
  Administered 2024-09-23: 25 ug via INTRAVENOUS

## 2024-09-23 MED ORDER — BUPIVACAINE LIPOSOME 1.3 % IJ SUSP
INTRAMUSCULAR | Status: AC
Start: 2024-09-23 — End: 2024-09-23
  Filled 2024-09-23: qty 20

## 2024-09-23 MED ORDER — PROPOFOL 500 MG/50ML IV EMUL
INTRAVENOUS | Status: DC | PRN
  Administered 2024-09-23: 40 ug/kg/min via INTRAVENOUS

## 2024-09-23 MED ORDER — BUPIVACAINE IN DEXTROSE 0.75-8.25 % IT SOLN
INTRATHECAL | Status: DC | PRN
  Administered 2024-09-23: 1.6 mL via INTRATHECAL

## 2024-09-23 MED ORDER — MENTHOL 3 MG MT LOZG: 1.0000 | LOZENGE | OROMUCOSAL | Status: DC | PRN

## 2024-09-23 MED ORDER — ASPIRIN 81 MG PO CHEW: 81.0000 mg | CHEWABLE_TABLET | Freq: Two times a day (BID) | ORAL | Status: DC

## 2024-09-23 MED ORDER — BUPIVACAINE LIPOSOME 1.3 % IJ SUSP
INTRAMUSCULAR | Status: DC | PRN
  Administered 2024-09-23: 20 mL

## 2024-09-23 MED ORDER — ROPIVACAINE HCL 5 MG/ML IJ SOLN
INTRAMUSCULAR | Status: DC | PRN
Start: 2024-09-23 — End: 2024-09-23
  Administered 2024-09-23: 20 mL via PERINEURAL

## 2024-09-23 MED ORDER — GABAPENTIN 300 MG PO CAPS
300.0000 mg | ORAL_CAPSULE | Freq: Three times a day (TID) | ORAL | Status: DC | PRN
  Administered 2024-09-23: 300 mg via ORAL
  Filled 2024-09-23: qty 1

## 2024-09-23 MED ORDER — POLYETHYLENE GLYCOL 3350 17 G PO PACK
17.0000 g | PACK | Freq: Every day | ORAL | Status: DC
  Administered 2024-09-24: 17 g via ORAL
  Filled 2024-09-23: qty 1

## 2024-09-23 MED ORDER — DOCUSATE SODIUM 100 MG PO CAPS
100.0000 mg | ORAL_CAPSULE | Freq: Two times a day (BID) | ORAL | Status: DC
Start: 2024-09-23 — End: 2024-09-24
  Administered 2024-09-23 – 2024-09-24 (×2): 100 mg via ORAL
  Filled 2024-09-23 (×2): qty 1

## 2024-09-23 MED ORDER — ACETAMINOPHEN 10 MG/ML IV SOLN: 1000.0000 mg | Freq: Once | INTRAVENOUS | Status: DC | PRN

## 2024-09-23 MED ORDER — HYDROCHLOROTHIAZIDE 25 MG PO TABS
25.0000 mg | ORAL_TABLET | Freq: Every day | ORAL | Status: DC
  Administered 2024-09-24: 25 mg via ORAL
  Filled 2024-09-23: qty 1

## 2024-09-23 MED ORDER — TRANEXAMIC ACID-NACL 1000-0.7 MG/100ML-% IV SOLN
1000.0000 mg | INTRAVENOUS | Status: AC
  Administered 2024-09-23: 1000 mg via INTRAVENOUS
  Filled 2024-09-23: qty 100

## 2024-09-23 MED ORDER — CHLORHEXIDINE GLUCONATE 0.12 % MT SOLN
15.0000 mL | Freq: Once | OROMUCOSAL | Status: AC
  Administered 2024-09-23: 15 mL via OROMUCOSAL

## 2024-09-23 MED ORDER — PHENYLEPHRINE 80 MCG/ML (10ML) SYRINGE FOR IV PUSH (FOR BLOOD PRESSURE SUPPORT)
PREFILLED_SYRINGE | INTRAVENOUS | Status: AC
  Filled 2024-09-23: qty 10

## 2024-09-23 MED ORDER — DEXMEDETOMIDINE HCL IN NACL 80 MCG/20ML IV SOLN
INTRAVENOUS | Status: AC
  Filled 2024-09-23: qty 20

## 2024-09-23 MED ORDER — DEXAMETHASONE SODIUM PHOSPHATE 10 MG/ML IJ SOLN
INTRAMUSCULAR | Status: DC | PRN
  Administered 2024-09-23: 8 mg via INTRAVENOUS

## 2024-09-23 MED ORDER — INSULIN ASPART 100 UNIT/ML IJ SOLN
0.0000 [IU] | Freq: Three times a day (TID) | INTRAMUSCULAR | Status: DC
  Administered 2024-09-24 (×2): 2 [IU] via SUBCUTANEOUS

## 2024-09-23 MED ORDER — DIPHENHYDRAMINE HCL 12.5 MG/5ML PO ELIX: 12.5000 mg | ORAL_SOLUTION | ORAL | Status: DC | PRN

## 2024-09-23 MED ORDER — METHOCARBAMOL 1000 MG/10ML IJ SOLN: 500.0000 mg | Freq: Four times a day (QID) | INTRAMUSCULAR | Status: DC | PRN

## 2024-09-23 MED ORDER — BUPIVACAINE-EPINEPHRINE 0.25% -1:200000 IJ SOLN
INTRAMUSCULAR | Status: DC | PRN
  Administered 2024-09-23: 30 mL

## 2024-09-23 SURGICAL SUPPLY — 56 items
ATTUNE PS FEM RT SZ 4 CEM KNEE (Femur) IMPLANT
ATTUNE PSRP INSR SZ4 6 KNEE (Insert) IMPLANT
BAG COUNTER SPONGE SURGICOUNT (BAG) IMPLANT
BAG ZIPLOCK 12X15 (MISCELLANEOUS) IMPLANT
BASE TIBIAL ROT PLAT SZ 3 KNEE (Knees) IMPLANT
BLADE SAW SGTL 11.0X1.19X90.0M (BLADE) ×2 IMPLANT
BLADE SAW SGTL 13.0X1.19X90.0M (BLADE) ×2 IMPLANT
BLADE SURG SZ10 CARB STEEL (BLADE) ×4 IMPLANT
BNDG ELASTIC 4INX 5YD STR LF (GAUZE/BANDAGES/DRESSINGS) ×2 IMPLANT
BNDG ELASTIC 6INX 5YD STR LF (GAUZE/BANDAGES/DRESSINGS) ×2 IMPLANT
BNDG ELASTIC 6X10 VLCR STRL LF (GAUZE/BANDAGES/DRESSINGS) IMPLANT
BOWL SMART MIX CTS (DISPOSABLE) ×2 IMPLANT
CEMENT HV SMART SET (Cement) ×4 IMPLANT
COVER SURGICAL LIGHT HANDLE (MISCELLANEOUS) ×2 IMPLANT
CUFF TRNQT CYL 34X4.125X (TOURNIQUET CUFF) ×2 IMPLANT
DRAPE INCISE IOBAN 66X45 STRL (DRAPES) IMPLANT
DRAPE SHEET LG 3/4 BI-LAMINATE (DRAPES) ×2 IMPLANT
DRAPE SURG ORHT 6 SPLT 77X108 (DRAPES) ×4 IMPLANT
DRAPE TOP 10253 STERILE (DRAPES) ×2 IMPLANT
DRAPE U-SHAPE 47X51 STRL (DRAPES) ×2 IMPLANT
DRSG AQUACEL AG ADV 3.5X10 (GAUZE/BANDAGES/DRESSINGS) ×2 IMPLANT
DURAPREP 26ML APPLICATOR (WOUND CARE) ×2 IMPLANT
ELECT BLADE TIP CTD 4 INCH (ELECTRODE) ×2 IMPLANT
ELECT PENCIL ROCKER SW 15FT (MISCELLANEOUS) ×2 IMPLANT
ELECT REM PT RETURN 15FT ADLT (MISCELLANEOUS) ×2 IMPLANT
GLOVE BIOGEL PI IND STRL 7.0 (GLOVE) ×2 IMPLANT
GLOVE BIOGEL PI IND STRL 8 (GLOVE) ×2 IMPLANT
GLOVE SURG SS PI 7.0 STRL IVOR (GLOVE) ×2 IMPLANT
GLOVE SURG SS PI 8.0 STRL IVOR (GLOVE) ×4 IMPLANT
GOWN STRL REUS W/ TWL XL LVL3 (GOWN DISPOSABLE) ×4 IMPLANT
HEMOSTAT SPONGE AVITENE ULTRA (HEMOSTASIS) IMPLANT
HOLDER FOLEY CATH W/STRAP (MISCELLANEOUS) ×2 IMPLANT
IMMOBILIZER KNEE 20 THIGH 36 (SOFTGOODS) ×2 IMPLANT
KIT TURNOVER KIT A (KITS) ×2 IMPLANT
MANIFOLD NEPTUNE II (INSTRUMENTS) ×2 IMPLANT
NS IRRIG 1000ML POUR BTL (IV SOLUTION) IMPLANT
PACK TOTAL KNEE CUSTOM (KITS) ×2 IMPLANT
PATELLA MEDIAL ATTUN 35MM KNEE (Knees) IMPLANT
PIN STEINMAN FIXATION KNEE (PIN) IMPLANT
PROTECTOR NERVE ULNAR (MISCELLANEOUS) ×2 IMPLANT
SEALER BIPOLAR AQUA 6.0 (INSTRUMENTS) ×2 IMPLANT
SET HNDPC FAN SPRY TIP SCT (DISPOSABLE) ×2 IMPLANT
SOLUTION PRONTOSAN WOUND 350ML (IRRIGATION / IRRIGATOR) ×2 IMPLANT
SPIKE FLUID TRANSFER (MISCELLANEOUS) ×2 IMPLANT
STAPLER VISISTAT (STAPLE) IMPLANT
STRIP CLOSURE SKIN 1/2X4 (GAUZE/BANDAGES/DRESSINGS) IMPLANT
SUT BONE WAX W31G (SUTURE) IMPLANT
SUT MNCRL AB 4-0 PS2 18 (SUTURE) IMPLANT
SUT STRATAFIX PDS+ 0 24IN (SUTURE) ×2 IMPLANT
SUT VIC AB 1 CT1 27XBRD ANTBC (SUTURE) ×6 IMPLANT
SUT VIC AB 2-0 CT1 TAPERPNT 27 (SUTURE) ×6 IMPLANT
TRAY FOLEY MTR SLVR 16FR STAT (SET/KITS/TRAYS/PACK) ×2 IMPLANT
TUBE SUCTION HIGH CAP CLEAR NV (SUCTIONS) ×2 IMPLANT
WATER STERILE IRR 1000ML POUR (IV SOLUTION) ×4 IMPLANT
WIPE CHG 2% PREP (PERSONAL CARE ITEMS) ×2 IMPLANT
WRAP KNEE MAXI GEL POST OP (GAUZE/BANDAGES/DRESSINGS) ×2 IMPLANT

## 2024-09-23 NOTE — Interval H&P Note (Signed)
 History and Physical Interval Note:  09/23/2024 12:32 PM  Ruth Long  has presented today for surgery, with the diagnosis of Degenerative joint disease.  The various methods of treatment have been discussed with the patient and family. After consideration of risks, benefits and other options for treatment, the patient has consented to  Procedure(s): ARTHROPLASTY, KNEE, TOTAL (Right) as a surgical intervention.  The patient's history has been reviewed, patient examined, no change in status, stable for surgery.  I have reviewed the patient's chart and labs.  Questions were answered to the patient's satisfaction.     [name] Billing

## 2024-09-23 NOTE — Transfer of Care (Signed)
 Immediate Anesthesia Transfer of Care Note  Patient: Ruth Long  Procedure(s) Performed: ARTHROPLASTY, KNEE, TOTAL (Right: Knee)  Patient Location: PACU  Anesthesia Type:Spinal  Level of Consciousness: drowsy  Airway & Oxygen Therapy: Patient Spontanous Breathing and Patient connected to face mask oxygen  Post-op Assessment: Report given to RN and Post -op Vital signs reviewed and stable  Post vital signs: Reviewed and stable  Last Vitals:  Vitals Value Taken Time  BP 131/73 09/23/24 15:07  Temp    Pulse 77 09/23/24 15:08  Resp 14 09/23/24 15:08  SpO2 100 % 09/23/24 15:08  Vitals shown include unfiled device data.  Last Pain:  Vitals:   09/23/24 1230  TempSrc:   PainSc: 0-No pain         Complications: No notable events documented.

## 2024-09-23 NOTE — Brief Op Note (Signed)
 09/23/2024  12:32 PM  PATIENT:  Ruth Long  68 y.o. female  PRE-OPERATIVE DIAGNOSIS:  Degenerative joint disease  POST-OPERATIVE DIAGNOSIS:  * No post-op diagnosis entered *  PROCEDURE:  Procedure(s): ARTHROPLASTY, KNEE, TOTAL (Right)  SURGEON:  Surgeons and Role:    DEWAINE Duwayne Purchase, MD - Primary  PHYSICIAN ASSISTANT:   ASSISTANTS: Bissell   ANESTHESIA:   spinal  EBL:  50   BLOOD ADMINISTERED:none  DRAINS: none   LOCAL MEDICATIONS USED:  MARCAINE      SPECIMEN:  No Specimen  DISPOSITION OF SPECIMEN:  N/A  COUNTS:  YES  TOURNIQUET:  60 min  DICTATION: .Other Dictation: Dictation Number [id number]  PLAN OF CARE: Admit for overnight observation  PATIENT DISPOSITION:  PACU - hemodynamically stable.   Delay start of Pharmacological VTE agent (>24hrs) due to surgical blood loss or risk of bleeding: no

## 2024-09-23 NOTE — Anesthesia Postprocedure Evaluation (Signed)
 Anesthesia Post Note  Patient: Ruth Long  Procedure(s) Performed: ARTHROPLASTY, KNEE, TOTAL (Right: Knee)     Patient location during evaluation: PACU Anesthesia Type: Regional, MAC and Spinal Level of consciousness: awake and alert Pain management: pain level controlled Vital Signs Assessment: post-procedure vital signs reviewed and stable Respiratory status: spontaneous breathing, nonlabored ventilation, respiratory function stable and patient connected to nasal cannula oxygen Cardiovascular status: stable and blood pressure returned to baseline Postop Assessment: no apparent nausea or vomiting Anesthetic complications: no   No notable events documented.  Last Vitals:  Vitals:   09/23/24 1845 09/23/24 1853  BP:    Pulse: 72 71  Resp: 18 19  Temp: (!) 36.1 C   SpO2: 100% 98%    Last Pain:  Vitals:   09/23/24 1845  TempSrc:   PainSc: 2                  Cordella SQUIBB Reese Senk

## 2024-09-23 NOTE — Anesthesia Preprocedure Evaluation (Signed)
 Anesthesia Evaluation  Patient identified by MRN, date of birth, ID band Patient awake    Reviewed: Allergy & Precautions, NPO status , Patient's Chart, lab work & pertinent test results  History of Anesthesia Complications (+) PONV and history of anesthetic complications  Airway Mallampati: II  TM Distance: >3 FB Neck ROM: Full    Dental no notable dental hx.    Pulmonary neg pulmonary ROS   Pulmonary exam normal        Cardiovascular hypertension, Pt. on medications  Rhythm:Regular Rate:Normal     Neuro/Psych  Headaches  negative psych ROS   GI/Hepatic Neg liver ROS,GERD  Medicated,,  Endo/Other  Hypothyroidism    Renal/GU negative Renal ROS  negative genitourinary   Musculoskeletal  (+) Arthritis , Osteoarthritis,    Abdominal Normal abdominal exam  (+)   Peds  Hematology Lab Results      Component                Value               Date                      WBC                      6.5                 09/13/2024                HGB                      10.3 (L)            09/13/2024                HCT                      34.5 (L)            09/13/2024                MCV                      86.3                09/13/2024                PLT                      269                 09/13/2024             Lab Results      Component                Value               Date                      NA                       144                 09/13/2024                K  3.3 (L)             09/13/2024                CO2                      28                  09/13/2024                GLUCOSE                  131 (H)             09/13/2024                BUN                      23                  09/13/2024                CREATININE               1.12 (H)            09/13/2024                CALCIUM                  10.1                09/13/2024                GFRNONAA                 53  (L)              09/13/2024              Anesthesia Other Findings   Reproductive/Obstetrics                              Anesthesia Physical Anesthesia Plan  ASA: 2  Anesthesia Plan: MAC, Regional and Spinal   Post-op Pain Management: Regional block*   Induction: Intravenous  PONV Risk Score and Plan: 3 and Ondansetron , Dexamethasone , Propofol  infusion and Treatment may vary due to age or medical condition  Airway Management Planned: Simple Face Mask and Nasal Cannula  Additional Equipment: None  Intra-op Plan:   Post-operative Plan:   Informed Consent: I have reviewed the patients History and Physical, chart, labs and discussed the procedure including the risks, benefits and alternatives for the proposed anesthesia with the patient or authorized representative who has indicated his/her understanding and acceptance.     Dental advisory given  Plan Discussed with: CRNA  Anesthesia Plan Comments:         Anesthesia Quick Evaluation

## 2024-09-23 NOTE — Op Note (Signed)
 NAME: Ruth Long, Ruth Long MEDICAL RECORD NO: [id number] ACCOUNT NO: [id number] DATE OF BIRTH: 1956-05-22 FACILITY: [facility] LOCATION: [id number] PHYSICIAN: Reyes KYM Billing, MD  Operative Report   DATE OF PROCEDURE: 09/23/2024  PREOPERATIVE DIAGNOSES:  End-stage osteoarthrosis, varus deformity of the right knee.  POSTOPERATIVE DIAGNOSES:  End-stage osteoarthrosis, varus deformity of the right knee.  PROCEDURE PERFORMED: Right total knee arthroplasty utilizing a DePuy Attune rotating platform, 4 femur, 3 tibia, 6 mm insert, and 32 patella.  ANESTHESIA: Spinal.  ASSISTANT: Jaclyn Bissell, PA  HISTORY: A 68 year old with end-stage osteoarthrosis and varus deformity of the right knee is indicated for replacement of the degenerated joint. Risks and benefits were discussed including bleeding, infection, damage to neurovascular structures, no  change in symptoms, worsening of symptoms, DVT, PE, and anesthetic complications, etc.  DESCRIPTION OF PROCEDURE: With the patient in supine position after induction of adequate spinal anesthesia, 2 g of Kefzol , the right lower extremity was prepped, draped, and exsanguinated in usual sterile fashion. The thigh tourniquet was inflated to  225 mmHg. A midline incision was made over the knee. Full-thickness flaps were developed. A medial parapatellar arthrotomy was performed. Elevated soft tissues medially, preserving the MCL. The patella was gently everted. The knee was flexed.  Tricompartmental osteoarthrosis was noted bone-on-bone in the medial compartment and patellofemoral joint. Remnants of the medial and lateral menisci were excised. Osteophytes were excised. Notch was placed above the femoral notch as a starting hole for  the femoral drill, which was drilled in line with the femur, entering it atraumatically, confirmed with a T-handle prior to that irrigation, irrigating the canal.  Next, intramedullary guide, 5-degree right, 9 off the distal femur was selected,  pinned, and the cut was performed. The femur was sized off the anterior cortex to be a 4, 3 degrees of external rotation pinned. The block was applied. The distal femoral  cut was performed, anterior, posterior and chamfer without notching the femur. Next, I subluxed the tibia, low side was medially. External alignment guide was utilized, 3 off the defect, bisecting tibiotalar joint, 3-degree slope, parallel to the shaft,  and pinned. I performed the tibial cut, checked the extension gap, satisfactory with a 6 insert. The knee was flexed. Attention was turned towards the tibia. Harvested bone and impacted into the distal femur. Sized to 3, maximizing coverage just medial  third of the tibial tubercle, pinned and drilled centrally, punch guided. Then, a box cut guide was applied to the distal femur, bisecting the canal, pinned, performed a box cut without difficulty. A trial femur, drilled in the lug holes, fit  satisfactory. Placed a 6 mm insert, reduced it. It had full extension, full flexion, and good stability to varus and valgus stress at 0 and 30 degrees. Negative anterior drawer.  Next, everted the patella and measured it to be 21, planed it to a 13 mm utilizing a shim and a crab claw to add an additional millimeter. Next, I sized it to a 32 paddle parallel to the joint surface, drilling the peg holes, trial patella, and had  excellent patellofemoral tracking after reduction. All instrumentation was removed. I checked posteriorly and irrigated the joint. I used the Aquamantys for cauterizing the geniculates, flexed the knee. All surfaces were thoroughly dried. Drilled holes  in the proximal tibia.  Next, mixed cement on the back table with appropriate vacuum and centrifuge. Cement was then placed on the proximal tibia, digitally pressurizing it. Cement was placed on the tibial component and impacted into place with  the redundant cement removed.  Cement was placed on the femoral component and the  femur and impacted into place. It fit flush, and redundant cement was removed. I placed a trial insert, reduced it to full extension, held an axial load throughout the curing of the cement. Cemented and  clamped the patella.  Redundant cement removed. I placed Marcaine  with epinephrine  followed by Prontosan in the wound and covered it during the curing of the cement. The tourniquet was deflated at 60 minutes. Any bleeding was cauterized with an  Aquamantys. Bone wax was placed on the cancellous surfaces. I removed the insert and meticulously removed all redundant cement. Pulsatile lavage was used to clean the joint, followed by Prontosan. I then placed a permanent 6 mm polyethylene insert and  reduced it. I had excellent stability.  In mid-flexion, I then reapproximated the patellar arthrotomy with 1 Vicryl interrupted figure-of-eight sutures. I performed a lateral release to improve tracking. Then, this was oversewn with a running Stratafix. Following this, I had excellent  patellofemoral tracking, negative anterior drawer, and excellent stability. Copiously irrigated the subcutaneous tissue with Prontosan. Subcutaneous with 2-0, and skin with subcuticular and Monocryl. A sterile dressing was applied, placed in an  immobilizer, and transported to the recovery room in satisfactory condition.  The patient tolerated the procedure well. No complications.   Assistant, Jaclyn Bissell, [place], was used throughout the case for patient positioning, traction, and closure.  BLOOD LOSS: 50 mL       PAA D: 09/23/2024 2:50:27 pm T: 09/23/2024 11:43:00 pm  JOB: [id number]/ [id number]

## 2024-09-23 NOTE — Plan of Care (Signed)
   Problem: Coping: Goal: Level of anxiety will decrease Outcome: Progressing   Problem: Pain Managment: Goal: General experience of comfort will improve and/or be controlled Outcome: Progressing   Problem: Safety: Goal: Ability to remain free from injury will improve Outcome: Progressing

## 2024-09-23 NOTE — Discharge Instructions (Signed)
 Elevate leg above heart 6x a day for each Use knee immobilizer while walking until can SLR x 10 Use knee immobilizer in bed to keep knee in extension Aquacel dressing may remain in place for one week. May shower with aquacel dressing in place. If the dressing becomes saturated or peels off, you may remove aquacel dressing. Do not remove steri-strips if they are present. Place new dressing with gauze and tape or ACE bandage which should be kept clean and dry and changed daily.  INSTRUCTIONS AFTER JOINT REPLACEMENT   Remove items at home which could result in a fall. This includes throw rugs or furniture in walking pathways ICE to the affected joint every three hours while awake for 30 minutes at a time, for at least the first 3-5 days, and then as needed for pain and swelling.  Continue to use ice for pain and swelling. You may notice swelling that will progress down to the foot and ankle.  This is normal after surgery.  Elevate your leg when you are not up walking on it.   Continue to use the breathing machine you got in the hospital (incentive spirometer) which will help keep your temperature down.  It is common for your temperature to cycle up and down following surgery, especially at night when you are not up moving around and exerting yourself.  The breathing machine keeps your lungs expanded and your temperature down.   DIET:  As you were doing prior to hospitalization, we recommend a well-balanced diet.  DRESSING / WOUND CARE / SHOWERING  Keep the surgical dressing for one week.  The dressing is water  proof, so you can shower without any extra covering.  IF THE DRESSING FALLS OFF or the wound gets wet inside, change the dressing with sterile gauze.  Please use good hand washing techniques before changing the dressing.  Do not use any lotions or creams on the incision until instructed by your surgeon.    ACTIVITY  Increase activity slowly as tolerated, but follow the weight bearing  instructions below.   No driving for 6 weeks or until further direction given by your physician.  You cannot drive while taking narcotics.  No lifting or carrying greater than 10 lbs. until further directed by your surgeon. Avoid periods of inactivity such as sitting longer than an hour when not asleep. This helps prevent blood clots.  You may return to work once you are authorized by your doctor.     WEIGHT BEARING   Weight bearing as tolerated with assist device (walker, cane, etc) as directed, use it as long as suggested by your surgeon or therapist, typically at least 4-6 weeks.   EXERCISES  Results after joint replacement surgery are often greatly improved when you follow the exercise, range of motion and muscle strengthening exercises prescribed by your doctor. Safety measures are also important to protect the joint from further injury. Any time any of these exercises cause you to have increased pain or swelling, decrease what you are doing until you are comfortable again and then slowly increase them. If you have problems or questions, call your caregiver or physical therapist for advice.   Rehabilitation is important following a joint replacement. After just a few days of immobilization, the muscles of the leg can become weakened and shrink (atrophy).  These exercises are designed to build up the tone and strength of the thigh and leg muscles and to improve motion. Often times heat used for twenty to thirty minutes  before working out will loosen up your tissues and help with improving the range of motion but do not use heat for the first two weeks following surgery (sometimes heat can increase post-operative swelling).   These exercises can be done on a training (exercise) mat, on the floor, on a table or on a bed. Use whatever works the best and is most comfortable for you.    Use music or television while you are exercising so that the exercises are a pleasant break in your day. This  will make your life better with the exercises acting as a break in your routine that you can look forward to.   Perform all exercises about fifteen times, three times per day or as directed.  You should exercise both the operative leg and the other leg as well.  Exercises include:   Quad Sets - Tighten up the muscle on the front of the thigh (Quad) and hold for 5-10 seconds.   Straight Leg Raises - With your knee straight (if you were given a brace, keep it on), lift the leg to 60 degrees, hold for 3 seconds, and slowly lower the leg.  Perform this exercise against resistance later as your leg gets stronger.  Leg Slides: Lying on your back, slowly slide your foot toward your buttocks, bending your knee up off the floor (only go as far as is comfortable). Then slowly slide your foot back down until your leg is flat on the floor again.  Angel Wings: Lying on your back spread your legs to the side as far apart as you can without causing discomfort.  Hamstring Strength:  Lying on your back, push your heel against the floor with your leg straight by tightening up the muscles of your buttocks.  Repeat, but this time bend your knee to a comfortable angle, and push your heel against the floor.  You may put a pillow under the heel to make it more comfortable if necessary.   A rehabilitation program following joint replacement surgery can speed recovery and prevent re-injury in the future due to weakened muscles. Contact your doctor or a physical therapist for more information on knee rehabilitation.    CONSTIPATION  Constipation is defined medically as fewer than three stools per week and severe constipation as less than one stool per week.  Even if you have a regular bowel pattern at home, your normal regimen is likely to be disrupted due to multiple reasons following surgery.  Combination of anesthesia, postoperative narcotics, change in appetite and fluid intake all can affect your bowels.   YOU MUST use  at least one of the following options; they are listed in order of increasing strength to get the job done.  They are all available over the counter, and you may need to use some, POSSIBLY even all of these options:    Drink plenty of fluids (prune juice may be helpful) and high fiber foods Colace 100 mg by mouth twice a day  Senokot for constipation as directed and as needed Dulcolax (bisacodyl ), take with full glass of water   Miralax  (polyethylene glycol) once or twice a day as needed.  If you have tried all these things and are unable to have a bowel movement in the first 3-4 days after surgery call either your surgeon or your primary doctor.    If you experience loose stools or diarrhea, hold the medications until you stool forms back up.  If your symptoms do not get better within 1  week or if they get worse, check with your doctor.  If you experience the worst abdominal pain ever or develop nausea or vomiting, please contact the office immediately for further recommendations for treatment.   ITCHING:  If you experience itching with your medications, try taking only a single pain pill, or even half a pain pill at a time.  You can also use Benadryl  over the counter for itching or also to help with sleep.   TED HOSE STOCKINGS:  Use stockings on both legs until for at least 2 weeks or as directed by physician office. They may be removed at night for sleeping.  MEDICATIONS:  See your medication summary on the "After Visit Summary" that nursing will review with you.  You may have some home medications which will be placed on hold until you complete the course of blood thinner medication.  It is important for you to complete the blood thinner medication as prescribed.  PRECAUTIONS:  If you experience chest pain or shortness of breath - call 911 immediately for transfer to the hospital emergency department.   If you develop a fever greater that 101 F, purulent drainage from wound, increased  redness or drainage from wound, foul odor from the wound/dressing, or calf pain - CONTACT YOUR SURGEON.                                                   FOLLOW-UP APPOINTMENTS:  If you do not already have a post-op appointment, please call the office for an appointment to be seen by your surgeon.  Guidelines for how soon to be seen are listed in your "After Visit Summary", but are typically between 1-4 weeks after surgery.  OTHER INSTRUCTIONS:   Knee Replacement:  Do not place pillow under knee, focus on keeping the knee straight while resting. CPM instructions: 0-90 degrees, 2 hours in the morning, 2 hours in the afternoon, and 2 hours in the evening. Place foam block, curve side up under heel at all times except when in CPM or when walking.  DO NOT modify, tear, cut, or change the foam block in any way.  POST-OPERATIVE OPIOID TAPER INSTRUCTIONS: It is important to wean off of your opioid medication as soon as possible. If you do not need pain medication after your surgery it is ok to stop day one. Opioids include: Codeine, Hydrocodone(Norco, Vicodin), Oxycodone (Percocet, oxycontin ) and hydromorphone  amongst others.  Long term and even short term use of opiods can cause: Increased pain response Dependence Constipation Depression Respiratory depression And more.  Withdrawal symptoms can include Flu like symptoms Nausea, vomiting And more Techniques to manage these symptoms Hydrate well Eat regular healthy meals Stay active Use relaxation techniques(deep breathing, meditating, yoga) Do Not substitute Alcohol to help with tapering If you have been on opioids for less than two weeks and do not have pain than it is ok to stop all together.  Plan to wean off of opioids This plan should start within one week post op of your joint replacement. Maintain the same interval or time between taking each dose and first decrease the dose.  Cut the total daily intake of opioids by one tablet each  day Next start to increase the time between doses. The last dose that should be eliminated is the evening dose.   MAKE SURE YOU:  Understand these  instructions.  Get help right away if you are not doing well or get worse.    Thank you for letting us  be a part of your medical care team.  It is a privilege we respect greatly.  We hope these instructions will help you stay on track for a fast and full recovery!

## 2024-09-23 NOTE — Anesthesia Procedure Notes (Signed)
 Procedure Name: MAC Date/Time: 09/23/2024 12:40 PM  Performed by: Metta Andrea NOVAK, CRNAPre-anesthesia Checklist: Patient identified, Emergency Drugs available, Suction available, Patient being monitored and Timeout performed Oxygen Delivery Method: Simple face mask Placement Confirmation: positive ETCO2

## 2024-09-23 NOTE — Anesthesia Procedure Notes (Addendum)
 Anesthesia Regional Block: Adductor canal block   Pre-Anesthetic Checklist: , timeout performed,  Correct Patient, Correct Site, Correct Laterality,  Correct Procedure, Correct Position, site marked,  Risks and benefits discussed,  Surgical consent,  Pre-op evaluation,  At surgeon's request and post-op pain management  Laterality: Right  Prep: Dura Prep       Needles:  Injection technique: Single-shot  Needle Type: Echogenic Stimulator Needle     Needle Length: 10cm  Needle Gauge: 20     Additional Needles:   Procedures:,,,, ultrasound used (permanent image in chart),,    Narrative:  Start time: 09/23/2024 12:20 PM End time: 09/23/2024 12:23 PM Injection made incrementally with aspirations every 5 mL.  Performed by: Personally  Anesthesiologist: Dorethea Cordella SQUIBB, DO  Additional Notes: Patient identified. Risks/Benefits/Options discussed with patient including but not limited to bleeding, infection, nerve damage, failed block, incomplete pain control. Patient expressed understanding and wished to proceed. All questions were answered. Sterile technique was used throughout the entire procedure. Please see nursing notes for vital signs. Aspirated in 5cc intervals with injection for negative confirmation. Patient was given instructions on fall risk and not to get out of bed. All questions and concerns addressed with instructions to call with any issues or inadequate analgesia.

## 2024-09-24 ENCOUNTER — Other Ambulatory Visit (HOSPITAL_COMMUNITY): Payer: Self-pay

## 2024-09-24 DIAGNOSIS — M1711 Unilateral primary osteoarthritis, right knee: Secondary | ICD-10-CM | POA: Diagnosis not present

## 2024-09-24 LAB — BASIC METABOLIC PANEL WITH GFR
Anion gap: 14 (ref 5–15)
BUN: 25 mg/dL — ABNORMAL HIGH (ref 8–23)
CO2: 24 mmol/L (ref 22–32)
Calcium: 9.5 mg/dL (ref 8.9–10.3)
Chloride: 99 mmol/L (ref 98–111)
Creatinine, Ser: 1.13 mg/dL — ABNORMAL HIGH (ref 0.44–1.00)
GFR, Estimated: 52 mL/min — ABNORMAL LOW (ref >60)
Glucose, Bld: 227 mg/dL — ABNORMAL HIGH (ref 70–99)
Potassium: 3.7 mmol/L (ref 3.5–5.1)
Sodium: 138 mmol/L (ref 135–145)

## 2024-09-24 LAB — CBC
HCT: 31.3 % — ABNORMAL LOW (ref 36.0–46.0)
Hemoglobin: 9.6 g/dL — ABNORMAL LOW (ref 12.0–15.0)
MCH: 26.1 pg (ref 26.0–34.0)
MCHC: 30.7 g/dL (ref 30.0–36.0)
MCV: 85.1 fL (ref 80.0–100.0)
Platelets: 261 K/uL (ref 150–400)
RBC: 3.68 MIL/uL — ABNORMAL LOW (ref 3.87–5.11)
RDW: 14.2 % (ref 11.5–15.5)
WBC: 12.1 K/uL — ABNORMAL HIGH (ref 4.0–10.5)
nRBC: 0 % (ref 0.0–0.2)

## 2024-09-24 LAB — GLUCOSE, CAPILLARY
Glucose-Capillary: 146 mg/dL — ABNORMAL HIGH (ref 70–99)
Glucose-Capillary: 150 mg/dL — ABNORMAL HIGH (ref 70–99)

## 2024-09-24 LAB — HEMOGLOBIN A1C
Hgb A1c MFr Bld: 6.5 % — ABNORMAL HIGH (ref 4.8–5.6)
Mean Plasma Glucose: 139.85 mg/dL

## 2024-09-24 MED ORDER — HYDROMORPHONE HCL 2 MG PO TABS
2.0000 mg | ORAL_TABLET | ORAL | 0 refills | Status: AC | PRN
  Filled 2024-09-24: qty 42, 7d supply, fill #0

## 2024-09-24 MED ORDER — METHOCARBAMOL 500 MG PO TABS
500.0000 mg | ORAL_TABLET | Freq: Three times a day (TID) | ORAL | 1 refills | Status: AC
  Filled 2024-09-24: qty 60, 20d supply, fill #0

## 2024-09-24 MED ORDER — POLYETHYLENE GLYCOL 3350 17 GM/SCOOP PO POWD
17.0000 g | Freq: Every day | ORAL | 0 refills | Status: AC
  Filled 2024-09-24: qty 238, 14d supply, fill #0

## 2024-09-24 MED ORDER — METHOCARBAMOL 500 MG PO TABS
500.0000 mg | ORAL_TABLET | Freq: Three times a day (TID) | ORAL | 1 refills | Status: AC | PRN
  Filled 2024-09-24: qty 30, 10d supply, fill #0

## 2024-09-24 MED ORDER — ASPIRIN 81 MG PO TBEC
81.0000 mg | DELAYED_RELEASE_TABLET | Freq: Two times a day (BID) | ORAL | 1 refills | Status: AC
  Filled 2024-09-24: qty 60, 30d supply, fill #0

## 2024-09-24 MED ORDER — HYDROMORPHONE HCL 2 MG PO TABS: 2.0000 mg | ORAL_TABLET | ORAL | Status: DC | PRN

## 2024-09-24 MED ORDER — DOCUSATE SODIUM 100 MG PO CAPS
100.0000 mg | ORAL_CAPSULE | Freq: Two times a day (BID) | ORAL | 2 refills | Status: AC
  Filled 2024-09-24: qty 60, 30d supply, fill #0

## 2024-09-24 NOTE — Progress Notes (Signed)
 Discharge meds in a secure bag delivered to pt in room by this RN

## 2024-09-24 NOTE — Evaluation (Signed)
 Physical Therapy Evaluation Patient Details Name: Ruth Long MRN: [id number] DOB: 1956/04/05 Today's Date: 09/24/2024  History of Present Illness  Pt is a 68 year old female s/p R TKA on 09/24/24  Clinical Impression  Patient evaluated by Physical Therapy with no further acute PT needs identified. All education has been completed and the patient has no further questions. Pt ambulated in hallway, practiced step and performed LE exercises.  Pt provided with HEP handout.  Pt feels ready for d/c home today. See below for any follow-up Physical Therapy or equipment needs. PT is signing off. Thank you for this referral.         If plan is discharge home, recommend the following:     Can travel by private vehicle        Equipment Recommendations None recommended by PT  Recommendations for Other Services       Functional Status Assessment Patient has had a recent decline in their functional status and demonstrates the ability to make significant improvements in function in a reasonable and predictable amount of time.     Precautions / Restrictions Precautions Precautions: Fall;Knee Restrictions RLE Weight Bearing Per Provider Order: Weight bearing as tolerated      Mobility  Bed Mobility Overal bed mobility: Needs Assistance Bed Mobility: Supine to Sit, Sit to Supine     Supine to sit: Supervision Sit to supine: Supervision   General bed mobility comments: cues for self assist    Transfers Overall transfer level: Needs assistance Equipment used: Rolling walker (2 wheels) Transfers: Sit to/from Stand Sit to Stand: Contact guard assist, Supervision           General transfer comment: verbal cues for UE and LE positioning for pain control    Ambulation/Gait Ambulation/Gait assistance: Contact guard assist Gait Distance (Feet): 120 Feet Assistive device: Rolling walker (2 wheels) Gait Pattern/deviations: Step-to pattern, Decreased stance time - right,  Antalgic Gait velocity: decr     General Gait Details: verbal cues for sequence, RW positioning, step length, posture  Stairs Stairs: Yes Stairs assistance: Contact guard assist Stair Management: Step to pattern, Forwards, With walker Number of Stairs: 1 General stair comments: verbal cues for positioning, technique, safety; pt performed twice and reports understanding  Wheelchair Mobility     Tilt Bed    Modified Rankin (Stroke Patients Only)       Balance                                             Pertinent Vitals/Pain Pain Assessment Pain Assessment: 0-10 Pain Score: 4  Pain Location: right knee Pain Descriptors / Indicators: Sore, Aching Pain Intervention(s): Repositioned, Monitored during session, Premedicated before session    Home Living Family/patient expects to be discharged to:: Private residence Living Arrangements: Spouse/significant other Available Help at Discharge: Family (daughter) Type of Home: House Home Access: Stairs to enter Entrance Stairs-Rails: None Entrance Stairs-Number of Steps: 1+1   Home Layout: One level Home Equipment: Agricultural consultant (2 wheels)      Prior Function Prior Level of Function : Independent/Modified Independent                     Extremity/Trunk Assessment        Lower Extremity Assessment Lower Extremity Assessment: RLE deficits/detail RLE Deficits / Details: able to perform SLR, R knee AAROM approx 4-80*  Communication   Communication Communication: No apparent difficulties    Cognition Arousal: Alert Behavior During Therapy: WFL for tasks assessed/performed   PT - Cognitive impairments: No apparent impairments                         Following commands: Intact       Cueing Cueing Techniques: Verbal cues     General Comments      Exercises Total Joint Exercises Ankle Circles/Pumps: AROM, Both, 10 reps Quad Sets: AROM, Both, 10 reps Heel Slides:  Right, 10 reps, AAROM Hip ABduction/ADduction: AROM, Right, 10 reps Straight Leg Raises: AAROM, Right, 10 reps Long Arc Quad: AROM, Seated, Right, 10 reps   Assessment/Plan    PT Assessment All further PT needs can be met in the next venue of care  PT Problem List Decreased strength;Decreased range of motion;Decreased mobility;Pain       PT Treatment Interventions      PT Goals (Current goals can be found in the Care Plan section)  Acute Rehab PT Goals PT Goal Formulation: All assessment and education complete, DC therapy    Frequency       Co-evaluation               AM-PAC PT 6 Clicks Mobility  Outcome Measure Help needed turning from your back to your side while in a flat bed without using bedrails?: A Little Help needed moving from lying on your back to sitting on the side of a flat bed without using bedrails?: A Little Help needed moving to and from a bed to a chair (including a wheelchair)?: A Little Help needed standing up from a chair using your arms (e.g., wheelchair or bedside chair)?: A Little Help needed to walk in hospital room?: A Little Help needed climbing 3-5 steps with a railing? : A Little 6 Click Score: 18    End of Session Equipment Utilized During Treatment: Gait belt Activity Tolerance: Patient tolerated treatment well Patient left: in bed;with call bell/phone within reach;with family/visitor present Nurse Communication: Mobility status PT Visit Diagnosis: Difficulty in walking, not elsewhere classified (R26.2)    Time: 8880-8863 PT Time Calculation (min) (ACUTE ONLY): 17 min   Charges:   PT Evaluation $PT Eval Low Complexity: 1 Low   PT General Charges $$ ACUTE PT VISIT: 1 Visit        Tari PT, DPT Physical Therapist Acute Rehabilitation Services Office: [phone]   Tari CROME Payson 09/24/2024, 1:33 PM

## 2024-09-24 NOTE — Progress Notes (Signed)
    Subjective: Patient seen in rounds for Dr. Duwayne.  Patient reports pain as mild to moderate.  Denies N/V/CP/SOB/Abd pain. She reports she is doing okay. She reports some pain in the back of her knee this morning.  She denies tingling or numbness in LE bilaterally.  Family at bedside.  Catheter removed this am, void pending.  All questions solicited and answered.   Objective:   VITALS:   Vitals:   09/23/24 2152 09/24/24 0208 09/24/24 0653 09/24/24 0921  BP: (!) 153/93 138/82 137/77 137/88  Pulse: 80 72 67 73  Resp: 18 16 16 16   Temp: 98.1 F (36.7 C) 98.4 F (36.9 C) 98.4 F (36.9 C) 98.9 F (37.2 C)  TempSrc: Oral  Oral Oral  SpO2: 96% 97% 99% 100%  Weight:      Height:        NAD Neurologically intact ABD soft Neurovascular intact Sensation intact distally Intact pulses distally Dorsiflexion/Plantar flexion intact Incision: dressing C/D/I No cellulitis present Compartment soft   Lab Results  Component Value Date   WBC 12.1 (H) 09/24/2024   HGB 9.6 (L) 09/24/2024   HCT 31.3 (L) 09/24/2024   MCV 85.1 09/24/2024   PLT 261 09/24/2024   BMET    Component Value Date/Time   NA 138 09/24/2024 0303   K 3.7 09/24/2024 0303   CL 99 09/24/2024 0303   CO2 24 09/24/2024 0303   GLUCOSE 227 (H) 09/24/2024 0303   BUN 25 (H) 09/24/2024 0303   CREATININE 1.13 (H) 09/24/2024 0303   CALCIUM 9.5 09/24/2024 0303   GFRNONAA 52 (L) 09/24/2024 0303     Assessment/Plan: 1 Day Post-Op   Principal Problem:   Right knee DJD  ABLA. Hemoglobin 9.6. Continue to monitor.   WBAT with walker DVT ppx: Aspirin , SCDs, TEDS PO pain control PT/OT: Has not seen yet. To come by today.  Dispo:  - D/c home with HHPT once cleared with PT and voided.    Ruth Long Potters 09/24/2024, 9:46 AM   [facility]  [place] [address], [address], [place], [place] [address] Phone: [phone] [email or web address] Facebook  [employer]

## 2024-09-26 ENCOUNTER — Encounter (HOSPITAL_COMMUNITY): Payer: Self-pay | Admitting: Specialist

## 2024-10-17 ENCOUNTER — Ambulatory Visit
Admission: RE | Admit: 2024-10-17 | Discharge: 2024-10-17 | Disposition: A | Discharge status: Home / Self Care | Source: Ambulatory Visit | Attending: Family Medicine | Admitting: Family Medicine

## 2024-10-17 DIAGNOSIS — Z1231 Encounter for screening mammogram for malignant neoplasm of breast: Secondary | ICD-10-CM
